# Patient Record
Sex: Male | Born: 1990 | Race: Black or African American | Hispanic: No | Marital: Single | Smoking: Never smoker
Health system: Southern US, Community
[De-identification: ages and names within clinical notes are randomized; demographics above are authoritative.]

## PROBLEM LIST (undated history)

## (undated) HISTORY — PX: DENTAL SURGERY: SHX609

---
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
## 2008-02-28 ENCOUNTER — Emergency Department: Payer: Self-pay | Admitting: Emergency Medicine

## 2010-11-25 ENCOUNTER — Emergency Department: Payer: Self-pay | Admitting: Emergency Medicine

## 2010-11-30 ENCOUNTER — Emergency Department: Payer: Self-pay | Admitting: Emergency Medicine

## 2014-02-12 ENCOUNTER — Emergency Department: Payer: Self-pay | Admitting: Emergency Medicine

## 2014-03-31 ENCOUNTER — Emergency Department: Payer: Self-pay | Admitting: Emergency Medicine

## 2015-03-15 ENCOUNTER — Encounter: Payer: Self-pay | Admitting: Emergency Medicine

## 2015-03-15 ENCOUNTER — Emergency Department
Admission: EM | Admit: 2015-03-15 | Discharge: 2015-03-15 | Disposition: A | Discharge status: Home / Self Care | Payer: Self-pay | Attending: Emergency Medicine | Admitting: Emergency Medicine

## 2015-03-15 DIAGNOSIS — R531 Weakness: Secondary | ICD-10-CM | POA: Insufficient documentation

## 2015-03-15 DIAGNOSIS — R519 Headache, unspecified: Secondary | ICD-10-CM

## 2015-03-15 DIAGNOSIS — R197 Diarrhea, unspecified: Secondary | ICD-10-CM | POA: Insufficient documentation

## 2015-03-15 DIAGNOSIS — R110 Nausea: Secondary | ICD-10-CM | POA: Insufficient documentation

## 2015-03-15 DIAGNOSIS — R51 Headache: Secondary | ICD-10-CM | POA: Insufficient documentation

## 2015-03-15 LAB — CBC
HEMATOCRIT: 38.4 % — AB (ref 40.0–52.0)
HEMOGLOBIN: 13.1 g/dL (ref 13.0–18.0)
MCH: 32.3 pg (ref 26.0–34.0)
MCHC: 34.1 g/dL (ref 32.0–36.0)
MCV: 94.6 fL (ref 80.0–100.0)
PLATELETS: 150 10*3/uL (ref 150–440)
RBC: 4.06 MIL/uL — ABNORMAL LOW (ref 4.40–5.90)
RDW: 13.6 % (ref 11.5–14.5)
WBC: 7.8 10*3/uL (ref 3.8–10.6)

## 2015-03-15 LAB — COMPREHENSIVE METABOLIC PANEL
ALBUMIN: 4.3 g/dL (ref 3.5–5.0)
ALK PHOS: 54 U/L (ref 38–126)
ALT: 12 U/L — AB (ref 17–63)
AST: 19 U/L (ref 15–41)
Anion gap: 7 (ref 5–15)
BUN: 12 mg/dL (ref 6–20)
CALCIUM: 9.7 mg/dL (ref 8.9–10.3)
CHLORIDE: 105 mmol/L (ref 101–111)
CO2: 29 mmol/L (ref 22–32)
Creatinine, Ser: 0.82 mg/dL (ref 0.61–1.24)
GFR calc Af Amer: >60 mL/min (ref >60)
GFR calc non Af Amer: >60 mL/min (ref >60)
GLUCOSE: 100 mg/dL — AB (ref 65–99)
POTASSIUM: 3.7 mmol/L (ref 3.5–5.1)
Sodium: 141 mmol/L (ref 135–145)
Total Bilirubin: <0.1 mg/dL — ABNORMAL LOW (ref 0.3–1.2)
Total Protein: 7.3 g/dL (ref 6.5–8.1)

## 2015-03-15 NOTE — Discharge Instructions (Signed)
Your exam and evaluation are reassuring. Return to emergency department for any new or worsening condition including fever, vomiting, black or bloody stools, concern for dehydration, chest pain, trouble breathing, dizziness passing out.   Diarrhea Diarrhea is frequent loose and watery bowel movements. It can cause you to feel weak and dehydrated. Dehydration can cause you to become tired and thirsty, have a dry mouth, and have decreased urination that often is dark yellow. Diarrhea is a sign of another problem, most often an infection that will not last long. In most cases, diarrhea typically lasts 2-3 days. However, it can last longer if it is a sign of something more serious. It is important to treat your diarrhea as directed by your caregiver to lessen or prevent future episodes of diarrhea. CAUSES  Some common causes include:  Gastrointestinal infections caused by viruses, bacteria, or parasites.  Food poisoning or food allergies.  Certain medicines, such as antibiotics, chemotherapy, and laxatives.  Artificial sweeteners and fructose.  Digestive disorders. HOME CARE INSTRUCTIONS  Ensure adequate fluid intake (hydration): Have 1 cup (8 oz) of fluid for each diarrhea episode. Avoid fluids that contain simple sugars or sports drinks, fruit juices, whole milk products, and sodas. Your urine should be clear or pale yellow if you are drinking enough fluids. Hydrate with an oral rehydration solution that you can purchase at pharmacies, retail stores, and online. You can prepare an oral rehydration solution at home by mixing the following ingredients together:   - tsp table salt.   tsp baking soda.   tsp salt substitute containing potassium chloride.  1  tablespoons sugar.  1 L (34 oz) of water.  Certain foods and beverages may increase the speed at which food moves through the gastrointestinal (GI) tract. These foods and beverages should be avoided and include:  Caffeinated and  alcoholic beverages.  High-fiber foods, such as raw fruits and vegetables, nuts, seeds, and whole grain breads and cereals.  Foods and beverages sweetened with sugar alcohols, such as xylitol, sorbitol, and mannitol.  Some foods may be well tolerated and may help thicken stool including:  Starchy foods, such as rice, toast, pasta, low-sugar cereal, oatmeal, grits, baked potatoes, crackers, and bagels.  Bananas.  Applesauce.  Add probiotic-rich foods to help increase healthy bacteria in the GI tract, such as yogurt and fermented milk products.  Wash your hands well after each diarrhea episode.  Only take over-the-counter or prescription medicines as directed by your caregiver.  Take a warm bath to relieve any burning or pain from frequent diarrhea episodes. SEEK IMMEDIATE MEDICAL CARE IF:   You are unable to keep fluids down.  You have persistent vomiting.  You have blood in your stool, or your stools are black and tarry.  You do not urinate in 6-8 hours, or there is only a small amount of very dark urine.  You have abdominal pain that increases or localizes.  You have weakness, dizziness, confusion, or light-headedness.  You have a severe headache.  Your diarrhea gets worse or does not get better.  You have a fever or persistent symptoms for more than 2-3 days.  You have a fever and your symptoms suddenly get worse. MAKE SURE YOU:   Understand these instructions.  Will watch your condition.  Will get help right away if you are not doing well or get worse. Document Released: 07/18/2002 Document Revised: 12/12/2013 Document Reviewed: 04/04/2012 [facility] Patient Information 2015 [place], [place]. This information is not intended to replace advice given to you  by your health care provider. Make sure you discuss any questions you have with your health care provider.  Headaches, Frequently Asked Questions MIGRAINE HEADACHES Q: What is migraine? What causes it? How can I  treat it? A: Generally, migraine headaches begin as a dull ache. Then they develop into a constant, throbbing, and pulsating pain. You may experience pain at the temples. You may experience pain at the front or back of one or both sides of the head. The pain is usually accompanied by a combination of:  Nausea.  Vomiting.  Sensitivity to light and noise. Some people (about 15%) experience an aura (see below) before an attack. The cause of migraine is believed to be chemical reactions in the brain. Treatment for migraine may include over-the-counter or prescription medications. It may also include self-help techniques. These include relaxation training and biofeedback.  Q: What is an aura? A: About 15% of people with migraine get an "aura". This is a sign of neurological symptoms that occur before a migraine headache. You may see wavy or jagged lines, dots, or flashing lights. You might experience tunnel vision or blind spots in one or both eyes. The aura can include visual or auditory hallucinations (something imagined). It may include disruptions in smell (such as strange odors), taste or touch. Other symptoms include:  Numbness.  A "pins and needles" sensation.  Difficulty in recalling or speaking the correct word. These neurological events may last as long as 60 minutes. These symptoms will fade as the headache begins. Q: What is a trigger? A: Certain physical or environmental factors can lead to or "trigger" a migraine. These include:  Foods.  Hormonal changes.  Weather.  Stress. It is important to remember that triggers are different for everyone. To help prevent migraine attacks, you need to figure out which triggers affect you. Keep a headache diary. This is a good way to track triggers. The diary will help you talk to your healthcare professional about your condition. Q: Does weather affect migraines? A: Bright sunshine, hot, humid conditions, and drastic changes in barometric  pressure may lead to, or "trigger," a migraine attack in some people. But studies have shown that weather does not act as a trigger for everyone with migraines. Q: What is the link between migraine and hormones? A: Hormones start and regulate many of your body's functions. Hormones keep your body in balance within a constantly changing environment. The levels of hormones in your body are unbalanced at times. Examples are during menstruation, pregnancy, or menopause. That can lead to a migraine attack. In fact, about three quarters of all women with migraine report that their attacks are related to the menstrual cycle.  Q: Is there an increased risk of stroke for migraine sufferers? A: The likelihood of a migraine attack causing a stroke is very remote. That is not to say that migraine sufferers cannot have a stroke associated with their migraines. In persons under age 94, the most common associated factor for stroke is migraine headache. But over the course of a person's normal life span, the occurrence of migraine headache may actually be associated with a reduced risk of dying from cerebrovascular disease due to stroke.  Q: What are acute medications for migraine? A: Acute medications are used to treat the pain of the headache after it has started. Examples over-the-counter medications, NSAIDs, ergots, and triptans.  Q: What are the triptans? A: Triptans are the newest class of abortive medications. They are specifically targeted to treat migraine. Triptans  are vasoconstrictors. They moderate some chemical reactions in the brain. The triptans work on receptors in your brain. Triptans help to restore the balance of a neurotransmitter called serotonin. Fluctuations in levels of serotonin are thought to be a main cause of migraine.  Q: Are over-the-counter medications for migraine effective? A: Over-the-counter, or "OTC," medications may be effective in relieving mild to moderate pain and associated  symptoms of migraine. But you should see your caregiver before beginning any treatment regimen for migraine.  Q: What are preventive medications for migraine? A: Preventive medications for migraine are sometimes referred to as "prophylactic" treatments. They are used to reduce the frequency, severity, and length of migraine attacks. Examples of preventive medications include antiepileptic medications, antidepressants, beta-blockers, calcium channel blockers, and NSAIDs (nonsteroidal anti-inflammatory drugs). Q: Why are anticonvulsants used to treat migraine? A: During the past few years, there has been an increased interest in antiepileptic drugs for the prevention of migraine. They are sometimes referred to as "anticonvulsants". Both epilepsy and migraine may be caused by similar reactions in the brain.  Q: Why are antidepressants used to treat migraine? A: Antidepressants are typically used to treat people with depression. They may reduce migraine frequency by regulating chemical levels, such as serotonin, in the brain.  Q: What alternative therapies are used to treat migraine? A: The term "alternative therapies" is often used to describe treatments considered outside the scope of conventional Western medicine. Examples of alternative therapy include acupuncture, acupressure, and yoga. Another common alternative treatment is herbal therapy. Some herbs are believed to relieve headache pain. Always discuss alternative therapies with your caregiver before proceeding. Some herbal products contain arsenic and other toxins. TENSION HEADACHES Q: What is a tension-type headache? What causes it? How can I treat it? A: Tension-type headaches occur randomly. They are often the result of temporary stress, anxiety, fatigue, or anger. Symptoms include soreness in your temples, a tightening band-like sensation around your head (a "vice-like" ache). Symptoms can also include a pulling feeling, pressure sensations, and  contracting head and neck muscles. The headache begins in your forehead, temples, or the back of your head and neck. Treatment for tension-type headache may include over-the-counter or prescription medications. Treatment may also include self-help techniques such as relaxation training and biofeedback. CLUSTER HEADACHES Q: What is a cluster headache? What causes it? How can I treat it? A: Cluster headache gets its name because the attacks come in groups. The pain arrives with little, if any, warning. It is usually on one side of the head. A tearing or bloodshot eye and a runny nose on the same side of the headache may also accompany the pain. Cluster headaches are believed to be caused by chemical reactions in the brain. They have been described as the most severe and intense of any headache type. Treatment for cluster headache includes prescription medication and oxygen. SINUS HEADACHES Q: What is a sinus headache? What causes it? How can I treat it? A: When a cavity in the bones of the face and skull (a sinus) becomes inflamed, the inflammation will cause localized pain. This condition is usually the result of an allergic reaction, a tumor, or an infection. If your headache is caused by a sinus blockage, such as an infection, you will probably have a fever. An x-ray will confirm a sinus blockage. Your caregiver's treatment might include antibiotics for the infection, as well as antihistamines or decongestants.  REBOUND HEADACHES Q: What is a rebound headache? What causes it? How can I treat  it? A: A pattern of taking acute headache medications too often can lead to a condition known as "rebound headache." A pattern of taking too much headache medication includes taking it more than 2 days per week or in excessive amounts. That means more than the label or a caregiver advises. With rebound headaches, your medications not only stop relieving pain, they actually begin to cause headaches. Doctors treat  rebound headache by tapering the medication that is being overused. Sometimes your caregiver will gradually substitute a different type of treatment or medication. Stopping may be a challenge. Regularly overusing a medication increases the potential for serious side effects. Consult a caregiver if you regularly use headache medications more than 2 days per week or more than the label advises. ADDITIONAL QUESTIONS AND ANSWERS Q: What is biofeedback? A: Biofeedback is a self-help treatment. Biofeedback uses special equipment to monitor your body's involuntary physical responses. Biofeedback monitors:  Breathing.  Pulse.  Heart rate.  Temperature.  Muscle tension.  Brain activity. Biofeedback helps you refine and perfect your relaxation exercises. You learn to control the physical responses that are related to stress. Once the technique has been mastered, you do not need the equipment any more. Q: Are headaches hereditary? A: Four out of five (80%) of people that suffer report a family history of migraine. Scientists are not sure if this is genetic or a family predisposition. Despite the uncertainty, a child has a 50% chance of having migraine if one parent suffers. The child has a 75% chance if both parents suffer.  Q: Can children get headaches? A: By the time they reach high school, most young people have experienced some type of headache. Many safe and effective approaches or medications can prevent a headache from occurring or stop it after it has begun.  Q: What type of doctor should I see to diagnose and treat my headache? A: Start with your primary caregiver. Discuss his or her experience and approach to headaches. Discuss methods of classification, diagnosis, and treatment. Your caregiver may decide to recommend you to a headache specialist, depending upon your symptoms or other physical conditions. Having diabetes, allergies, etc., may require a more comprehensive and inclusive approach  to your headache. The National Headache Foundation will provide, upon request, a list of [facility] physician members in your state. Document Released: 10/18/2003 Document Revised: 10/20/2011 Document Reviewed: 03/27/2008 [facility] Patient Information 2015 [place], [place]. This information is not intended to replace advice given to you by your health care provider. Make sure you discuss any questions you have with your health care provider.

## 2015-03-15 NOTE — ED Notes (Signed)
Pt report diarrhea and headache x3 days, reports weakness yesterday and today. Pt reports excedrin and advil around 0800. Pt reports mowing yard yesterday morning.

## 2015-03-15 NOTE — ED Provider Notes (Addendum)
[facility] Emergency Department Provider Note   ____________________________________________  Time seen: 6:30 PM I have reviewed the triage vital signs and the triage nursing note.  HISTORY  Chief Complaint Diarrhea; Weakness; and Headache   Historian Patient  HPI Alejandro Cameron is a 24 y.o. male who had mild diarrhea, not black or bloody, and without fever for about 3 days. He's had some nausea without any vomiting. He's had decreased by mouth intake. He was outside in the heat yesterday and felt a little bit worse. He has had intermittent mild global headache. No neck pain or stiffness. No skin rash. He was out of work yesterday and needs a work note. He has no primary care physician, and needs referral for primary care    History reviewed. No pertinent past medical history. none  There are no active problems to display for this patient.   History reviewed. No pertinent past surgical history. none  No current outpatient prescriptions on file. none  Allergies Ibuprofen  No family history on file.  Social History History  Substance Use Topics  . Smoking status: Never Smoker   . Smokeless tobacco: Not on file  . Alcohol Use: No    Review of Systems  Constitutional: Negative for fever. Eyes: Negative for visual changes. ENT: Negative for sore throat. Cardiovascular: Negative for chest pain. Respiratory: Negative for shortness of breath. Gastrointestinal: Per history of present illness. Genitourinary: Negative for dysuria. Musculoskeletal: Negative for back pain. Skin: Negative for rash. Neurological: Negative for  focal weakness or numbness. 10 point Review of Systems otherwise negative ____________________________________________   PHYSICAL EXAM:  VITAL SIGNS: ED Triage Vitals  Enc Vitals Group     BP 03/15/15 1758 118/76 mmHg     Pulse Rate 03/15/15 1758 70     Resp 03/15/15 1758 16     Temp 03/15/15 1758 98.1 F (36.7 C)      Temp Source 03/15/15 1758 Oral     SpO2 03/15/15 1758 97 %     Weight 03/15/15 1758 165 lb (74.844 kg)     Height 03/15/15 1758  (1.88 m)     Head Cir --      Peak Flow --      Pain Score 03/15/15 1758 0     Pain Loc --      Pain Edu? --      Excl. in GC? --      Constitutional: Alert and oriented. Well appearing and in no distress. Eyes: Conjunctivae are normal. PERRL. Normal extraocular movements. ENT   Head: Normocephalic and atraumatic.   Nose: No congestion/rhinnorhea.   Mouth/Throat: Mucous membranes are moist.   Neck: No stridor. Cardiovascular/Chest: Normal rate, regular rhythm.  No murmurs, rubs, or gallops. Respiratory: Normal respiratory effort without tachypnea nor retractions. Breath sounds are clear and equal bilaterally. No wheezes/rales/rhonchi. Gastrointestinal: Soft. No distention, no guarding, no rebound. Nontender   Genitourinary/rectal:Deferred Musculoskeletal: Nontender with normal range of motion in all extremities. No joint effusions.  No lower extremity tenderness nor edema. Neurologic:  Normal speech and language. No gross or focal neurologic deficits are appreciated. Skin:  Skin is warm, dry and intact. No rash noted. Psychiatric: Mood and affect are normal. Speech and behavior are normal. Patient exhibits appropriate insight and judgment.  ____________________________________________   EKG I, Governor Rooks, MD, the attending physician have personally viewed and interpreted all ECGs.  No EKG performed ____________________________________________  LABS (pertinent positives/negatives)  CMP without significant abnormality White blood cell count  7.8, hemoglobin 13.1, platelet 150  ____________________________________________  RADIOLOGY All Xrays were viewed by me. Imaging interpreted by Radiologist.  None __________________________________________  PROCEDURES  Procedure(s) performed: None Critical Care performed:  None  ____________________________________________   ED COURSE / ASSESSMENT AND PLAN  CONSULTATIONS: None  Pertinent labs & imaging results that were available during my care of the patient were reviewed by me and considered in my medical decision making (see chart for details).   Patient is overall well-appearing and currently has no abdominal pain, headache or diarrhea. I think he was mostly here in order to obtain reassurance and work note. We discussed with during vitals, physical exam, and laboratory evaluation. He was referred to [facility] clinic for primary care follow-up.  Patient / Family / Caregiver informed of clinical course, medical decision-making process, and agree with plan.   I discussed return precautions, follow-up instructions, and discharged instructions with patient and/or family.  ___________________________________________   FINAL CLINICAL IMPRESSION(S) / ED DIAGNOSES   Final diagnoses:  Diarrhea  Acute nonintractable headache, unspecified headache type    FOLLOW UP  Referred to: [place] [facility], one week   Governor Rooks, MD 03/15/15 1851  Governor Rooks, MD 03/15/15 [phone]

## 2015-03-15 NOTE — ED Notes (Signed)
AAOx3.  Skin warm and dry. NAD.  Ambulates with easy and steady gait. NAD °

## 2015-09-20 ENCOUNTER — Encounter: Payer: Self-pay | Admitting: Emergency Medicine

## 2015-09-20 ENCOUNTER — Emergency Department: Payer: Self-pay

## 2015-09-20 DIAGNOSIS — G43909 Migraine, unspecified, not intractable, without status migrainosus: Secondary | ICD-10-CM | POA: Insufficient documentation

## 2015-09-20 NOTE — ED Notes (Signed)
Pt arrived to the ED for complaints of severe headache. Pt states that nothing helps. Pt is AOx4 in moderate pain stating that the light makes it worse.

## 2015-09-21 ENCOUNTER — Emergency Department
Admission: EM | Admit: 2015-09-21 | Discharge: 2015-09-21 | Disposition: A | Discharge status: Home / Self Care | Payer: Self-pay | Attending: Emergency Medicine | Admitting: Emergency Medicine

## 2015-09-21 DIAGNOSIS — G43009 Migraine without aura, not intractable, without status migrainosus: Secondary | ICD-10-CM

## 2015-09-21 MED ORDER — DEXAMETHASONE SODIUM PHOSPHATE 10 MG/ML IJ SOLN
10.0000 mg | Freq: Once | INTRAMUSCULAR | Status: AC
  Administered 2015-09-21: 10 mg via INTRAVENOUS
  Filled 2015-09-21: qty 1

## 2015-09-21 MED ORDER — ACETAMINOPHEN 500 MG PO TABS
1000.0000 mg | ORAL_TABLET | Freq: Once | ORAL | Status: AC
  Administered 2015-09-21: 1000 mg via ORAL
  Filled 2015-09-21: qty 2

## 2015-09-21 MED ORDER — KETOROLAC TROMETHAMINE 30 MG/ML IJ SOLN
15.0000 mg | Freq: Once | INTRAMUSCULAR | Status: AC
  Administered 2015-09-21: 15 mg via INTRAVENOUS
  Filled 2015-09-21: qty 1

## 2015-09-21 MED ORDER — DIPHENHYDRAMINE HCL 50 MG/ML IJ SOLN
25.0000 mg | Freq: Once | INTRAMUSCULAR | Status: AC
  Administered 2015-09-21: 25 mg via INTRAVENOUS
  Filled 2015-09-21: qty 1

## 2015-09-21 MED ORDER — METOCLOPRAMIDE HCL 5 MG/ML IJ SOLN
10.0000 mg | Freq: Once | INTRAMUSCULAR | Status: AC
Start: 2015-09-21 — End: 2015-09-21
  Administered 2015-09-21: 10 mg via INTRAVENOUS
  Filled 2015-09-21: qty 2

## 2015-09-21 MED ORDER — SODIUM CHLORIDE 0.9 % IV BOLUS (SEPSIS)
1000.0000 mL | INTRAVENOUS | Status: AC
  Administered 2015-09-21: 1000 mL via INTRAVENOUS

## 2015-09-21 NOTE — ED Notes (Signed)
Notified Brandon in pharmacy regarding toradol not scanning.

## 2015-09-21 NOTE — ED Provider Notes (Signed)
[facility] Emergency Department Provider Note  ____________________________________________  Time seen: Approximately 12:38 AM  I have reviewed the triage vital signs and the nursing notes.   HISTORY  Chief Complaint Headache    HPI Alejandro Cameron is a 25 y.o. male with no significant past medical history except for a couple of prior headaches who presents with severe headache for the last 2-3 days.  He states that he has been gradual in onset, global, and severe.  Light and sound both make it worse.  Rest makes it a little bit better but it will not go away.  He has had no focal neurological deficits.  He has had no fever/chills, neck pain, neck stiffness, shortness of breath, abdominal pain, dysuria, diarrhea.  He has had some nausea but no vomiting.He has had no recent medication changes.  He smokes tobacco but does not drink alcohol or use drugs.   History reviewed. No pertinent past medical history.  There are no active problems to display for this patient.   History reviewed. No pertinent past surgical history.  No current outpatient prescriptions on file.  Allergies Ibuprofen  History reviewed. No pertinent family history.  Social History Social History  Substance Use Topics  . Smoking status: Never Smoker   . Smokeless tobacco: None  . Alcohol Use: No    Review of Systems Constitutional: No fever/chills Eyes: No visual changes but positive for photophobia ENT: No sore throat.  No neck pain or stiffness. Cardiovascular: Denies chest pain. Respiratory: Denies shortness of breath. Gastrointestinal: No abdominal pain.  nausea, no vomiting.  No diarrhea.  No constipation. Genitourinary: Negative for dysuria. Musculoskeletal: Negative for back pain. Skin: Negative for rash. Neurological: Severe global headache with no focal neurological deficits  10-point ROS otherwise  negative.  ____________________________________________   PHYSICAL EXAM:  VITAL SIGNS: ED Triage Vitals  Enc Vitals Group     BP 09/20/15 2243 113/58 mmHg     Pulse Rate 09/20/15 2243 55     Resp 09/20/15 2243 16     Temp 09/20/15 2243 97.7 F (36.5 C)     Temp src --      SpO2 09/20/15 2243 100 %     Weight 09/20/15 2243 168 lb (76.204 kg)     Height 09/20/15 2243  (1.88 m)     Head Cir --      Peak Flow --      Pain Score 09/20/15 2245 10     Pain Loc --      Pain Edu? --      Excl. in GC? --     Constitutional: Alert and oriented. Well appearing and in no acute distress that does not appear uncomfortable  Eyes: Conjunctivae are normal. PERRL. EOMI. sensitivity to light makes it difficult to obtain an adequate funduscopic exam. Head: Atraumatic. Nose: No congestion/rhinnorhea. Mouth/Throat: Mucous membranes are moist.  Oropharynx non-erythematous. Neck: No stridor.  No meningismus Cardiovascular: Normal rate, regular rhythm. Grossly normal heart sounds.  Good peripheral circulation. Respiratory: Normal respiratory effort.  No retractions. Lungs CTAB. Gastrointestinal: Soft and nontender. No distention. No abdominal bruits. No CVA tenderness. Musculoskeletal: No lower extremity tenderness nor edema.  No joint effusions. Neurologic:  Normal speech and language. No gross focal neurologic deficits are appreciated.  Skin:  Skin is warm, dry and intact. No rash noted. Psychiatric: Mood and affect are normal. Speech and behavior are normal.  ____________________________________________   LABS (all labs ordered are listed, but only abnormal  results are displayed)  Labs Reviewed - No data to display ____________________________________________  EKG  None ____________________________________________  RADIOLOGY   Ct Head Wo Contrast  09/20/2015  CLINICAL DATA:  Acute onset of severe headache. Photosensitivity. Initial encounter. EXAM: CT HEAD WITHOUT CONTRAST  TECHNIQUE: Contiguous axial images were obtained from the base of the skull through the vertex without intravenous contrast. COMPARISON:  CT of the head performed 11/05/2007 FINDINGS: There is no evidence of acute infarction, mass lesion, or intra- or extra-axial hemorrhage on CT. The posterior fossa, including the cerebellum, brainstem and fourth ventricle, is within normal limits. The third and lateral ventricles, and basal ganglia are unremarkable in appearance. The cerebral hemispheres are symmetric in appearance, with normal gray-white differentiation. No mass effect or midline shift is seen. There is no evidence of fracture; visualized osseous structures are unremarkable in appearance. The visualized portions of the orbits are within normal limits. The paranasal sinuses and mastoid air cells are well-aerated. No significant soft tissue abnormalities are seen. IMPRESSION: Unremarkable noncontrast CT of the head. Electronically Signed   By: Roanna Raider M.D.   On: 09/20/2015 23:06    ____________________________________________   PROCEDURES  Procedure(s) performed: None  Critical Care performed: No ____________________________________________   INITIAL IMPRESSION / ASSESSMENT AND PLAN / ED COURSE  Pertinent labs & imaging results that were available during my care of the patient were reviewed by me and considered in my medical decision making (see chart for details).  Signs and symptoms most consistent with a migraine headache.  I will treat empirically with my usual nonnarcotic "migraine cocktail" and reassess.  The patient understands and agrees with plan.   (Note that documentation was delayed due to multiple ED patients requiring immediate care.)   Talbert Forest after receiving the medications, the patient states he feels better and wants to go home.  He said that his headache is improved and that he would rather lie in bed at home than here in the emergency department.  He has a ride  home.  I encouraged him to stay in order to allow the medications more time to work and to receive his IV fluids, but he refused and stated that he was going to leave whether I let him do so or not.  I provided my usual and customary written discharge instructions and return precautions.  He ambulated without any difficulty out of the emergency department and was going home with his grandmother driving.  ____________________________________________  FINAL CLINICAL IMPRESSION(S) / ED DIAGNOSES  Final diagnoses:  Nonintractable migraine, unspecified migraine type      NEW MEDICATIONS STARTED DURING THIS VISIT:  There are no discharge medications for this patient.    Loleta Rose, MD 09/21/15 [phone]

## 2015-09-21 NOTE — Discharge Instructions (Signed)
You have been seen in the Emergency Department (ED) for a migraine.  Please use Tylenol or Motrin as needed for symptoms, but only as written on the box, and take any regular medications that have been prescribed for you. °As we have discussed, please follow up with your doctor as soon as possible regarding today’s ED visit and your headache symptoms.   ° °Call your doctor or return to the Emergency Department (ED) if you have a worsening headache, sudden and severe headache, confusion, slurred speech, facial droop, weakness or numbness in any arm or leg, extreme fatigue, or other symptoms that concern you. ° ° °Migraine Headache °A migraine headache is an intense, throbbing pain on one or both sides of your head. A migraine can last for 30 minutes to several hours. °CAUSES  °The exact cause of a migraine headache is not always known. However, a migraine may be caused when nerves in the brain become irritated and release chemicals that cause inflammation. This causes pain. °Certain things may also trigger migraines, such as: °· Alcohol. °· Smoking. °· Stress. °· Menstruation. °· Aged cheeses. °· Foods or drinks that contain nitrates, glutamate, aspartame, or tyramine. °· Lack of sleep. °· Chocolate. °· Caffeine. °· Hunger. °· Physical exertion. °· Fatigue. °· Medicines used to treat chest pain (nitroglycerine), birth control pills, estrogen, and some blood pressure medicines. °SIGNS AND SYMPTOMS °· Pain on one or both sides of your head. °· Pulsating or throbbing pain. °· Severe pain that prevents daily activities. °· Pain that is aggravated by any physical activity. °· Nausea, vomiting, or both. °· Dizziness. °· Pain with exposure to bright lights, loud noises, or activity. °· General sensitivity to bright lights, loud noises, or smells. °Before you get a migraine, you may get warning signs that a migraine is coming (aura). An aura may include: °· Seeing flashing lights. °· Seeing bright spots, halos, or zigzag  lines. °· Having tunnel vision or blurred vision. °· Having feelings of numbness or tingling. °· Having trouble talking. °· Having muscle weakness. °DIAGNOSIS  °A migraine headache is often diagnosed based on: °· Symptoms. °· Physical exam. °· A CT scan or MRI of your head. These imaging tests cannot diagnose migraines, but they can help rule out other causes of headaches. °TREATMENT °Medicines may be given for pain and nausea. Medicines can also be given to help prevent recurrent migraines.  °HOME CARE INSTRUCTIONS °· Only take over-the-counter or prescription medicines for pain or discomfort as directed by your health care provider. The use of long-term narcotics is not recommended. °· Lie down in a dark, quiet room when you have a migraine. °· Keep a journal to find out what may trigger your migraine headaches. For example, write down: °¨ What you eat and drink. °¨ How much sleep you get. °¨ Any change to your diet or medicines. °· Limit alcohol consumption. °· Quit smoking if you smoke. °· Get 7-9 hours of sleep, or as recommended by your health care provider. °· Limit stress. °· Keep lights dim if bright lights bother you and make your migraines worse. °SEEK IMMEDIATE MEDICAL CARE IF:  °· Your migraine becomes severe. °· You have a fever. °· You have a stiff neck. °· You have vision loss. °· You have muscular weakness or loss of muscle control. °· You start losing your balance or have trouble walking. °· You feel faint or pass out. °· You have severe symptoms that are different from your first symptoms. °MAKE SURE YOU:  °·   Understand these instructions. °· Will watch your condition. °· Will get help right away if you are not doing well or get worse. °  °This information is not intended to replace advice given to you by your health care provider. Make sure you discuss any questions you have with your health care provider. °  °Document Released: 07/28/2005 Document Revised: 08/18/2014 Document Reviewed:  04/04/2013 °Elsevier Interactive Patient Education ©2016 [employer] ° ° ° °

## 2015-09-21 NOTE — ED Notes (Signed)
Pt discharged to home.  Family member driving.  Discharge instructions reviewed.  Verbalized understanding.  No questions or concerns at this time.  Teach back verified.  Pt in NAD.  No items left in ED.   

## 2015-09-21 NOTE — ED Notes (Signed)
Pt refusing IV fluids.  States that he feels that he will be more comfortable at home and states that he feels better at this time.

## 2015-12-24 ENCOUNTER — Emergency Department (HOSPITAL_COMMUNITY): Payer: Self-pay

## 2015-12-24 ENCOUNTER — Encounter (HOSPITAL_COMMUNITY): Payer: Self-pay | Admitting: Emergency Medicine

## 2015-12-24 ENCOUNTER — Emergency Department (HOSPITAL_COMMUNITY)
Admission: EM | Admit: 2015-12-24 | Discharge: 2015-12-24 | Disposition: A | Discharge status: Home / Self Care | Payer: Self-pay | Attending: Emergency Medicine | Admitting: Emergency Medicine

## 2015-12-24 DIAGNOSIS — Y92310 Basketball court as the place of occurrence of the external cause: Secondary | ICD-10-CM | POA: Insufficient documentation

## 2015-12-24 DIAGNOSIS — S4991XA Unspecified injury of right shoulder and upper arm, initial encounter: Secondary | ICD-10-CM | POA: Insufficient documentation

## 2015-12-24 DIAGNOSIS — Y9367 Activity, basketball: Secondary | ICD-10-CM | POA: Insufficient documentation

## 2015-12-24 DIAGNOSIS — Y998 Other external cause status: Secondary | ICD-10-CM | POA: Insufficient documentation

## 2015-12-24 DIAGNOSIS — M25511 Pain in right shoulder: Secondary | ICD-10-CM

## 2015-12-24 DIAGNOSIS — X58XXXA Exposure to other specified factors, initial encounter: Secondary | ICD-10-CM | POA: Insufficient documentation

## 2015-12-24 MED ORDER — DIAZEPAM 5 MG PO TABS
5.0000 mg | ORAL_TABLET | Freq: Once | ORAL | Status: AC
  Administered 2015-12-24: 5 mg via ORAL
  Filled 2015-12-24: qty 1

## 2015-12-24 MED ORDER — OXYCODONE-ACETAMINOPHEN 5-325 MG PO TABS
1.0000 | ORAL_TABLET | ORAL | Status: AC | PRN
  Administered 2015-12-24 (×2): 1 via ORAL
  Filled 2015-12-24 (×2): qty 1

## 2015-12-24 MED ORDER — NAPROXEN 250 MG PO TABS
500.0000 mg | ORAL_TABLET | Freq: Once | ORAL | Status: AC
  Administered 2015-12-24: 500 mg via ORAL
  Filled 2015-12-24: qty 2

## 2015-12-24 MED ORDER — NAPROXEN 500 MG PO TABS: 500.0000 mg | ORAL_TABLET | Freq: Two times a day (BID) | ORAL | Status: DC

## 2015-12-24 MED ORDER — DIAZEPAM 5 MG PO TABS: 5.0000 mg | ORAL_TABLET | Freq: Two times a day (BID) | ORAL | Status: DC

## 2015-12-24 NOTE — Discharge Instructions (Signed)
Mr. Linton HamDameon T Delagarza,  Nice meeting you! Please follow-up with your primary care provider. Return to the emergency department if you develop inability to move your arm, numbness/tingling, increased pain. Feel better soon!  S. Lane HackerNicole Twala Collings, PA-C

## 2015-12-24 NOTE — ED Notes (Signed)
Pt states was playing basketball on Friday and went to dunk the ball jumped in the air AND HAD HIS LEGS TAKEN OUT FROM UNDER HIM AND FELL ONTO RIGHT SHOULDER. PT C/O OF PAIN IN RIGHT SHOULDER WORSE WHEN LIFTING HIS RIGHT OUTWARD. Pt has equal radial pulses with sensation intact.

## 2015-12-24 NOTE — ED Notes (Signed)
Patient able to ambulate independently  

## 2015-12-24 NOTE — ED Provider Notes (Signed)
History  By signing my name below, I, Earmon Phoenix, attest that this documentation has been prepared under the direction and in the presence of S. Lane Hacker, PA-C. Electronically Signed: Earmon Phoenix, ED Scribe. 12/24/2015. 3:11 PM.  Chief Complaint  Patient presents with  . Shoulder Injury   The history is provided by the patient and medical records. No language interpreter was used.    HPI Comments:  Alejandro Cameron is a 25 y.o. male who presents to the Emergency Department complaining of a right shoulder injury that occurred three days ago. He states he was playing basketball and jumped in the air and his legs were knocked out from underneath him, causing his to land on his right shoulder. He reports associated neck pain. He has not taken anything for pain. Lifting his RUE increases the pain. He denies alleviating factors. He denies head trauma, LOC, bruising, wounds, nausea, vomiting, fever, chills, numbness, tingling or weakness of either upper extremity.   History reviewed. No pertinent past medical history. History reviewed. No pertinent past surgical history. No family history on file. Social History  Substance Use Topics  . Smoking status: Never Smoker   . Smokeless tobacco: None  . Alcohol Use: No    Review of Systems A complete 10 system review of systems was obtained and all systems are negative except as noted in the HPI and PMH.   Allergies  Ibuprofen  Home Medications   Prior to Admission medications   Medication Sig Start Date End Date Taking? Authorizing Provider  diazepam (VALIUM) 5 MG tablet Take 1 tablet (5 mg total) by mouth 2 (two) times daily. 12/24/15   Melton Krebs, PA-C  naproxen (NAPROSYN) 500 MG tablet Take 1 tablet (500 mg total) by mouth 2 (two) times daily. 12/24/15   Melton Krebs, PA-C   Triage Vitals: BP 113/71 mmHg  Pulse 88  Temp(Src) 98 F (36.7 C) (Oral)  Resp 16  Ht 6' 2.5" (1.892 m)  Wt 153 lb 4 oz (69.514  kg)  BMI 19.42 kg/m2  SpO2 98% Physical Exam  Constitutional: He is oriented to person, place, and time. He appears well-developed and well-nourished.  HENT:  Head: Normocephalic and atraumatic.  Eyes: EOM are normal.  Neck: Normal range of motion.  Cardiovascular: Normal rate.   Pulmonary/Chest: Effort normal.  Musculoskeletal: He exhibits tenderness.  Midline cervical tenderness. Diffuse right shoulder tenderness.  Neurological: He is alert and oriented to person, place, and time.  Strength 4/5 to right side, 5/5 on left. NVI.  Skin: Skin is warm and dry.  Psychiatric: He has a normal mood and affect. His behavior is normal.  Nursing note and vitals reviewed.   ED Course  Procedures DIAGNOSTIC STUDIES: Oxygen Saturation is 98% on RA, normal by my interpretation.   COORDINATION OF CARE: 3:10 PM- Will X-Ray neck. Pt verbalizes understanding and agrees to plan.  Medications  oxyCODONE-acetaminophen (PERCOCET/ROXICET) 5-325 MG per tablet 1 tablet (1 tablet Oral Given 12/24/15 1455)  diazepam (VALIUM) tablet 5 mg (5 mg Oral Given 12/24/15 1509)  naproxen (NAPROSYN) tablet 500 mg (500 mg Oral Given 12/24/15 1509)   Imaging Review Dg Cervical Spine Complete  12/24/2015  CLINICAL DATA:  Pain following fall 3 days prior EXAM: CERVICAL SPINE - COMPLETE 4+ VIEW COMPARISON:  None. FINDINGS: Frontal, lateral, open-mouth odontoid, and bilateral oblique views were obtained. There is dextroscoliosis and reversal of lordotic curvature. There is no fracture or spondylolisthesis. Prevertebral soft tissues and predental space regions are normal.  The disc spaces appear normal. There are small facet osteophytes at C4-5 and C5-6 bilaterally. IMPRESSION: No fracture or spondylolisthesis. Small facet osteophytes bilaterally at C4-5 and C5-6. Reversal of lordotic curvature and scoliosis most likely are secondary to muscle spasm. If there is clinical suspicion for ligamentous injury, lateral flexion-extension  views could be helpful to further assess. Electronically Signed   By: Bretta BangWilliam  Woodruff III M.D.   On: 12/24/2015 15:55   Dg Shoulder Right  12/24/2015  CLINICAL DATA:  Fall 3 days ago playing basketball with right shoulder pain. Initial encounter. EXAM: RIGHT SHOULDER - 2+ VIEW COMPARISON:  None. FINDINGS: There is no evidence of fracture or dislocation. There is no evidence of arthropathy or other focal bone abnormality. Soft tissues are unremarkable. IMPRESSION: Negative. Electronically Signed   By: Irish LackGlenn  Yamagata M.D.   On: 12/24/2015 14:46     I have personally reviewed and evaluated these images and lab results as part of my medical decision-making.   MDM   Final diagnoses:  Right shoulder pain   Patient X-Ray negative for obvious fracture or dislocation. Pain managed in ED. Pt advised to follow up with orthopedics if symptoms persist for possibility of missed fracture diagnosis. Patient given sling while in ED, conservative therapy recommended and discussed. Patient will be dc home & is agreeable with above plan.  I personally performed the services described in this documentation, which was scribed in my presence. The recorded information has been reviewed and is accurate.   Melton KrebsSamantha Nicole Eiko Mcgowen, PA-C 12/27/15 1129  Margarita Grizzleanielle Ray, MD 12/29/15 1430

## 2016-10-12 ENCOUNTER — Emergency Department
Admission: EM | Admit: 2016-10-12 | Discharge: 2016-10-12 | Disposition: A | Discharge status: Home / Self Care | Payer: Self-pay | Attending: Emergency Medicine | Admitting: Emergency Medicine

## 2016-10-12 ENCOUNTER — Encounter: Payer: Self-pay | Admitting: Emergency Medicine

## 2016-10-12 DIAGNOSIS — Z79899 Other long term (current) drug therapy: Secondary | ICD-10-CM | POA: Insufficient documentation

## 2016-10-12 DIAGNOSIS — K029 Dental caries, unspecified: Secondary | ICD-10-CM | POA: Insufficient documentation

## 2016-10-12 DIAGNOSIS — K0889 Other specified disorders of teeth and supporting structures: Secondary | ICD-10-CM

## 2016-10-12 MED ORDER — KETOROLAC TROMETHAMINE 10 MG PO TABS: 10.0000 mg | ORAL_TABLET | Freq: Four times a day (QID) | ORAL | 0 refills | Status: AC | PRN

## 2016-10-12 MED ORDER — KETOROLAC TROMETHAMINE 60 MG/2ML IM SOLN
30.0000 mg | Freq: Once | INTRAMUSCULAR | Status: AC
  Administered 2016-10-12: 30 mg via INTRAMUSCULAR
  Filled 2016-10-12: qty 2

## 2016-10-12 MED ORDER — AMOXICILLIN 500 MG PO CAPS: 500.0000 mg | ORAL_CAPSULE | Freq: Three times a day (TID) | ORAL | 0 refills | Status: AC

## 2016-10-12 NOTE — ED Provider Notes (Signed)
[facility] Emergency Department Provider Note  ____________________________________________  Time seen: Approximately 3:49 PM  I have reviewed the triage vital signs and the nursing notes.   HISTORY  Chief Complaint Dental Pain   HPI Alejandro Cameron is a 26 y.o. male presenting to the emergency department with 8/10 superior 3 pain for the past 5 days. Patient's dental pain is worse with chewing. Patient states that superior 3 has been "broken" and affected by dental caries. Patient has an appointment with a dentist on 10/16/2016. He presents to the emergency department due to increasing pain. Patient states that he had a small abscess on the roof of his mouth that began to spontaneously express purulent material. Patient denies fever. Patient states that he has tried Advanced care toothpaste, Listerine, Goody powders and Tylenol, which have not relieved his symptoms.   History reviewed. No pertinent past medical history.  There are no active problems to display for this patient.   History reviewed. No pertinent surgical history.  Prior to Admission medications   Medication Sig Start Date End Date Taking? Authorizing Provider  amoxicillin (AMOXIL) 500 MG capsule Take 1 capsule (500 mg total) by mouth 3 (three) times daily. 10/12/16 10/22/16  Orvil FeilJaclyn M Woods, PA-C  diazepam (VALIUM) 5 MG tablet Take 1 tablet (5 mg total) by mouth 2 (two) times daily. 12/24/15   Melton KrebsSamantha Nicole Riley, PA-C  ketorolac (TORADOL) 10 MG tablet Take 1 tablet (10 mg total) by mouth every 6 (six) hours as needed. 10/12/16 10/17/16  Orvil FeilJaclyn M Woods, PA-C  naproxen (NAPROSYN) 500 MG tablet Take 1 tablet (500 mg total) by mouth 2 (two) times daily. 12/24/15   Melton KrebsSamantha Nicole Riley, PA-C    Allergies Ibuprofen  History reviewed. No pertinent family history.  Social History Social History  Substance Use Topics  . Smoking status: Never Smoker  . Smokeless tobacco: Never Used  . Alcohol use No     Review of Systems  Constitutional: No fever/chills Eyes: No visual changes. No discharge ENT: Patient has dental pain.  Cardiovascular: no chest pain. Respiratory: no cough. No SOB. Gastrointestinal: No abdominal pain.  No nausea, no vomiting.  No diarrhea.  No constipation. Musculoskeletal: Negative for musculoskeletal pain. Skin: Negative for rash, abrasions, lacerations, ecchymosis. Neurological: Negative for headaches, focal weakness or numbness. ____________________________________________   PHYSICAL EXAM:  VITAL SIGNS: ED Triage Vitals  Enc Vitals Group     BP 10/12/16 1449 121/64     Pulse Rate 10/12/16 1449 84     Resp 10/12/16 1449 16     Temp 10/12/16 1449 98.2 F (36.8 C)     Temp Source 10/12/16 1449 Oral     SpO2 10/12/16 1449 99 %     Weight 10/12/16 1449 170 lb (77.1 kg)     Height 10/12/16 1449 6' 2.5" (1.892 m)     Head Circumference --      Peak Flow --      Pain Score 10/12/16 1519 8     Pain Loc --      Pain Edu? --      Excl. in GC? --     Constitutional: Alert and oriented. Well appearing and in no acute distress. Eyes: Conjunctivae are normal. PERRL. EOMI. Head: Atraumatic. ENT:      Ears: Tympanic membranes are pearly bilaterally without erythema, effusion or purulent exudate.      Nose: No congestion/rhinnorhea.      Mouth/Throat: Patient has broken superior 3, which is also affected  by dental caries. No gingival hypertrophy or abscess visualized surrounding superior 3. Neck: Full range of motion. Hematological/Lymphatic/Immunilogical: No cervical lymphadenopathy. Cardiovascular: Normal rate, regular rhythm. Normal S1 and S2.  Good peripheral circulation. Respiratory: Normal respiratory effort without tachypnea or retractions. Lungs CTAB. Good air entry to the bases with no decreased or absent breath sounds. Musculoskeletal: Jaw appears symmetric. No pain was elicited with TMJ testing bilaterally. Neurologic:  Normal speech and language.  No gross focal neurologic deficits are appreciated.  Skin: Patient has no edema of the right jaw.  ____________________________________________   LABS (all labs ordered are listed, but only abnormal results are displayed)  Labs Reviewed - No data to display ____________________________________________  EKG   ____________________________________________  RADIOLOGY   No results found.  ____________________________________________    PROCEDURES  Procedure(s) performed:    Procedures    Medications  ketorolac (TORADOL) injection 30 mg (30 mg Intramuscular Given 10/12/16 1603)     ____________________________________________   INITIAL IMPRESSION / ASSESSMENT AND PLAN / ED COURSE  Pertinent labs & imaging results that were available during my care of the patient were reviewed by me and considered in my medical decision making (see chart for details).  Review of the [place] CSRS was performed in accordance of the [facility] prior to dispensing any controlled drugs.     Dental Pain:  Assessment and Plan  Patient presents to the emergency department with superior 3 pain. On physical exam, superior 3 was visualized as broken with dental caries. No jaw edema was visualized. Patient was discharged with amoxicillin. An injection of Toradol was given in the emergency department. Patient was discharged with PO Toradol. Patient was advised to maintain dental appointment on 10/16/2016. Vital signs are reassuring at this time. All patient questions were answered. ____________________________________________  FINAL CLINICAL IMPRESSION(S) / ED DIAGNOSES  Final diagnoses:  Pain, dental      NEW MEDICATIONS STARTED DURING THIS VISIT:  Discharge Medication List as of 10/12/2016  4:03 PM    START taking these medications   Details  amoxicillin (AMOXIL) 500 MG capsule Take 1 capsule (500 mg total) by mouth 3 (three) times daily., Starting Sun 10/12/2016, Until Wed 10/22/2016, Print     ketorolac (TORADOL) 10 MG tablet Take 1 tablet (10 mg total) by mouth every 6 (six) hours as needed., Starting Sun 10/12/2016, Until Fri 10/17/2016, Print            This chart was dictated using voice recognition software/Dragon. Despite best efforts to proofread, errors can occur which can change the meaning. Any change was purely unintentional.    Orvil Feil, PA-C 10/12/16 1704    Phineas Semen, MD 10/12/16 [phone]

## 2016-10-12 NOTE — ED Triage Notes (Signed)
Pt reports dental pain x 4 days, states something popped in his mouth and tasted terrible.  Tried to get into the dentist unsuccessful

## 2017-03-03 IMAGING — CT CT HEAD W/O CM
1 series · 16 of 30 positions shown, 20 images · non-contrast
Comparison: CT of the head performed 11/05/2007

CLINICAL DATA: Acute onset of severe headache. Photosensitivity.
Initial encounter.

EXAM:
CT HEAD WITHOUT CONTRAST
TECHNIQUE: Contiguous axial images were obtained from the base of the skull
through the vertex without intravenous contrast.

[Series 2: head wo · axial · 0.45mm/px · z∈[+435,+579]mm · 16 of 36 slices shown, 20 images]
[im 2/36  brain]
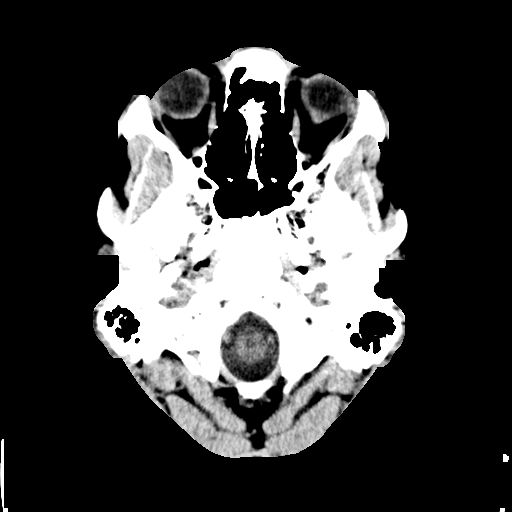
[im 2/36  bone]
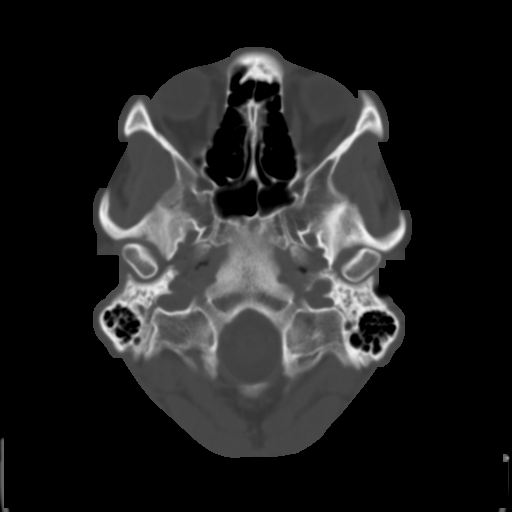
[im 4/36  brain]
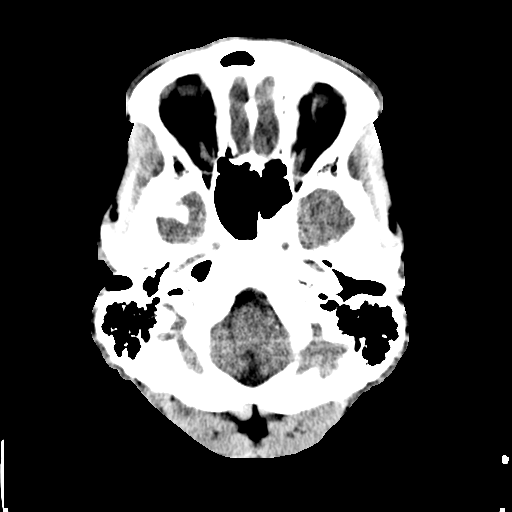
[im 7/36  brain]
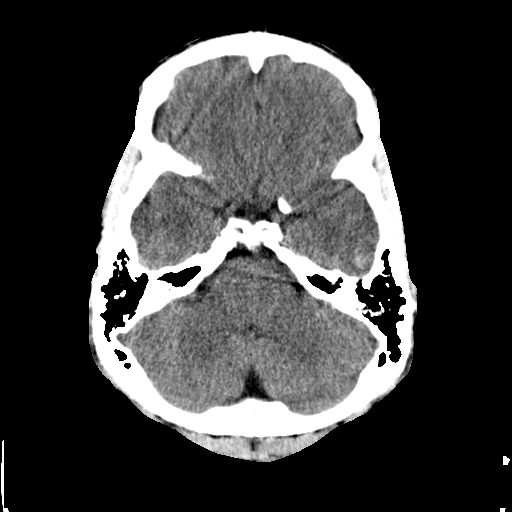
[im 9/36  brain]
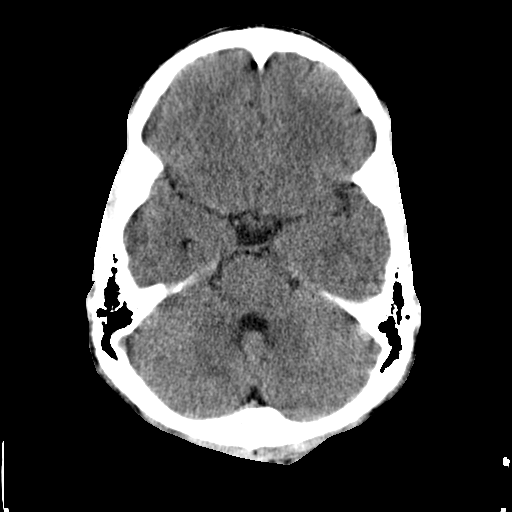
[im 10/36  brain]
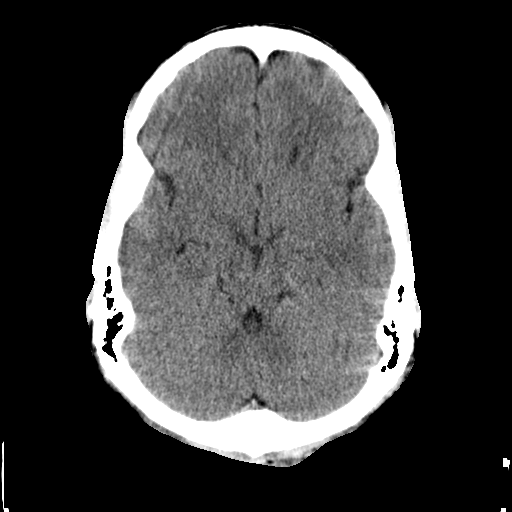
[im 10/36  bone]
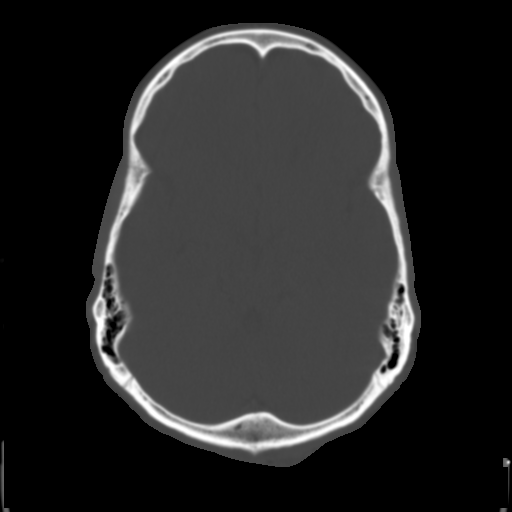
[im 13/36  brain]
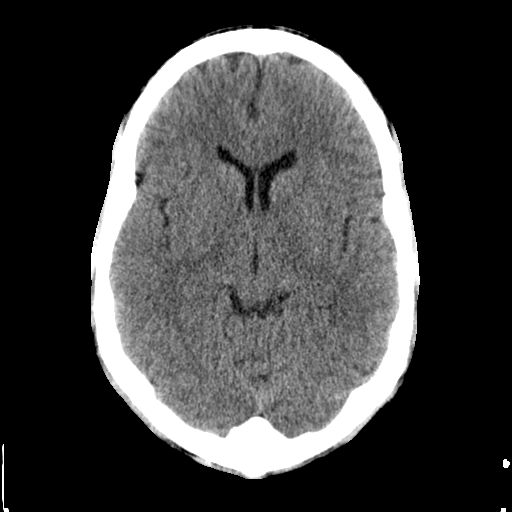
[im 15/36  brain]
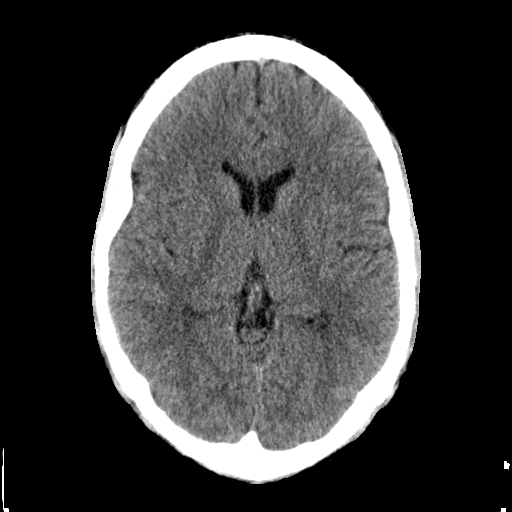
[im 17/36  brain]
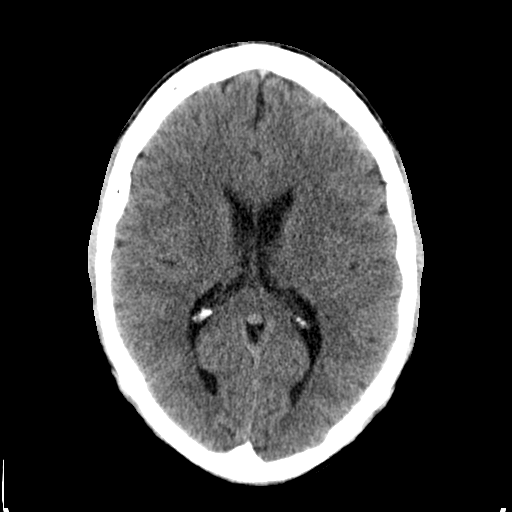
[im 19/36  brain]
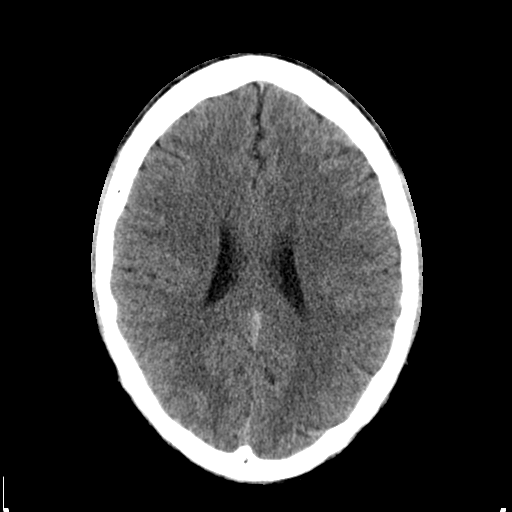
[im 19/36  bone]
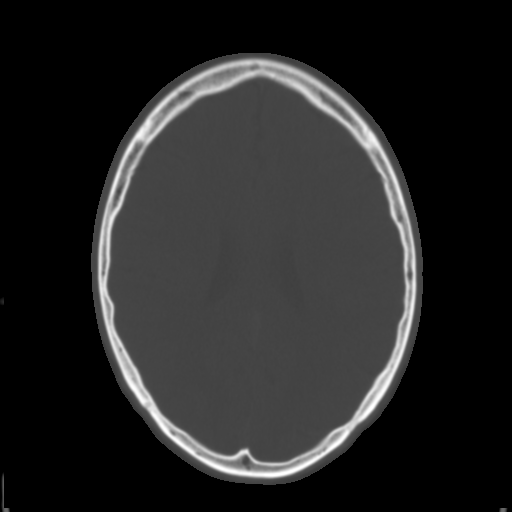
[im 21/36  brain]
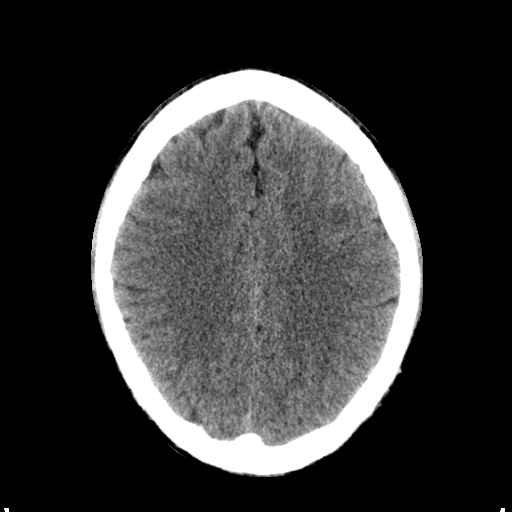
[im 23/36  brain]
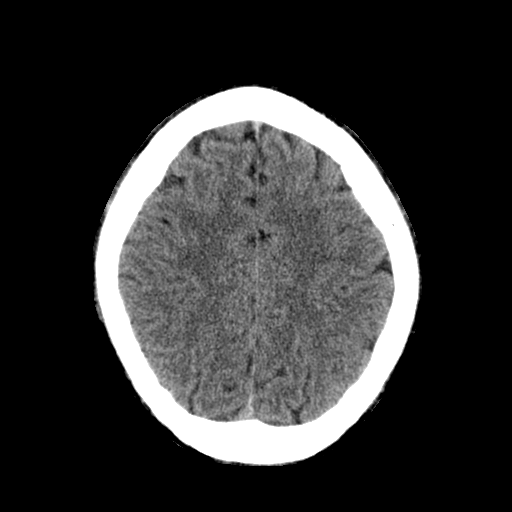
[im 26/36  brain]
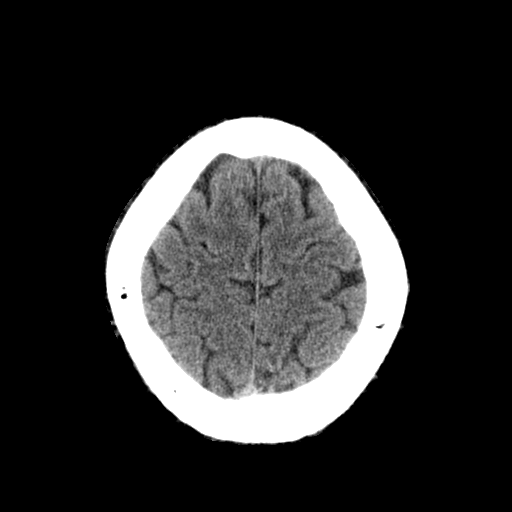
[im 27/36  brain]
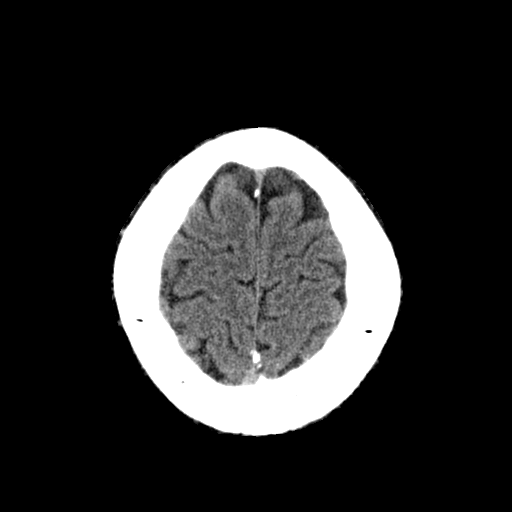
[im 27/36  bone]
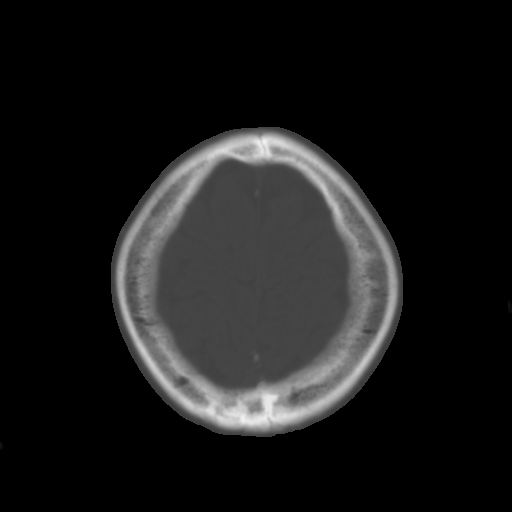
[im 29/36  brain]
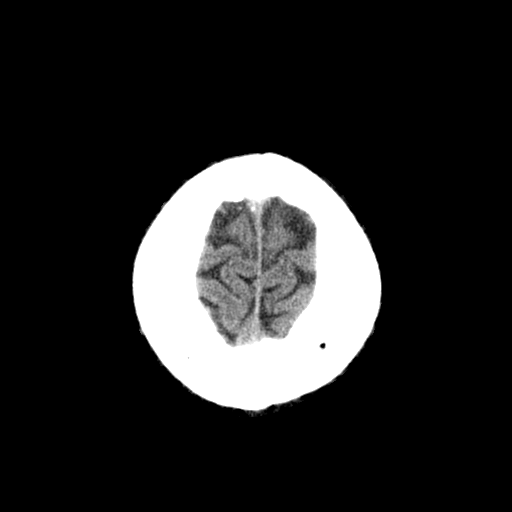
[im 32/36  brain]
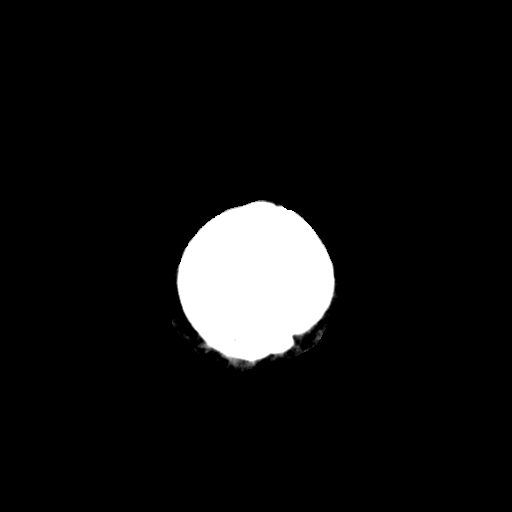
[im 34/36  brain]
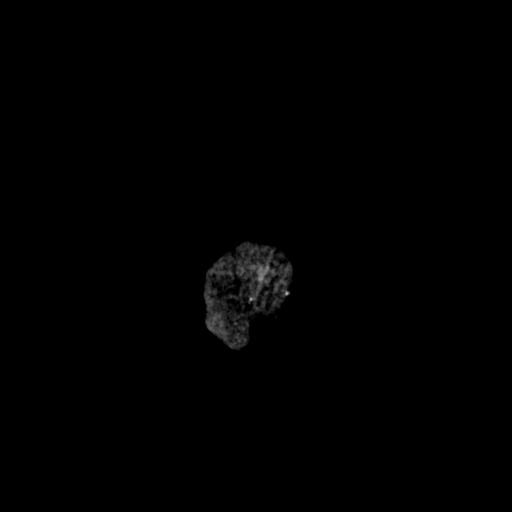

[16 of 30 positions shown; findings below may reference images not displayed]

FINDINGS: There is no evidence of acute infarction, mass lesion, or intra- or
extra-axial hemorrhage on CT.

The posterior fossa, including the cerebellum, brainstem and fourth
ventricle, is within normal limits. The third and lateral
ventricles, and basal ganglia are unremarkable in appearance. The
cerebral hemispheres are symmetric in appearance, with normal
gray-white differentiation. No mass effect or midline shift is seen.

There is no evidence of fracture; visualized osseous structures are
unremarkable in appearance. The visualized portions of the orbits
are within normal limits. The paranasal sinuses and mastoid air
cells are well-aerated. No significant soft tissue abnormalities are
seen.
IMPRESSION: Unremarkable noncontrast CT of the head.

## 2017-06-06 IMAGING — DX DG SHOULDER 2+V*R*
3 series · 3 of 3 positions shown · non-contrast
Comparison: None.

CLINICAL DATA: Fall 3 days ago playing basketball with right
shoulder pain. Initial encounter.

EXAM:
RIGHT SHOULDER - 2+ VIEW

[shoulder grashey]
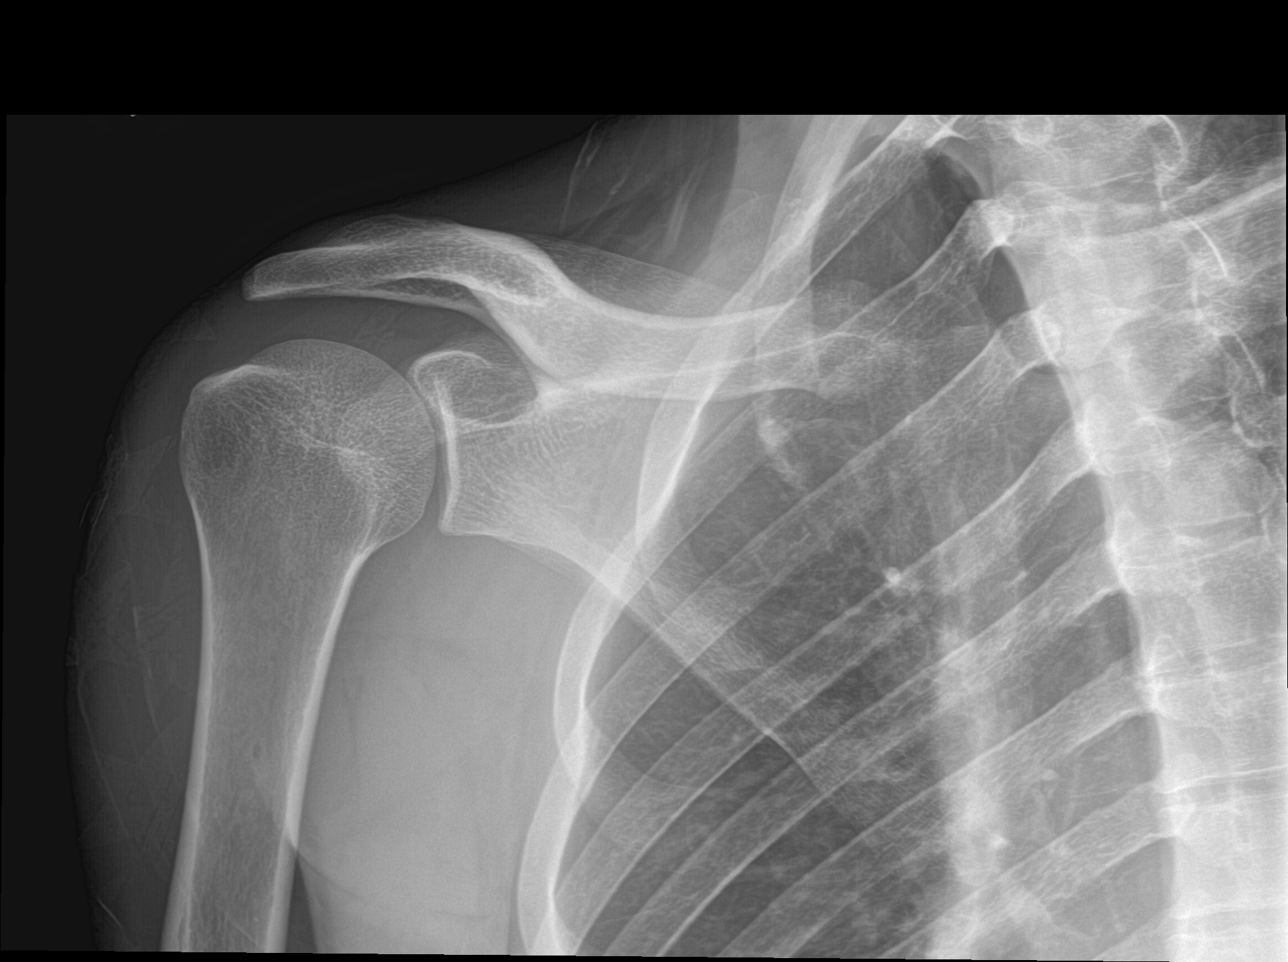

[shoulder y view]
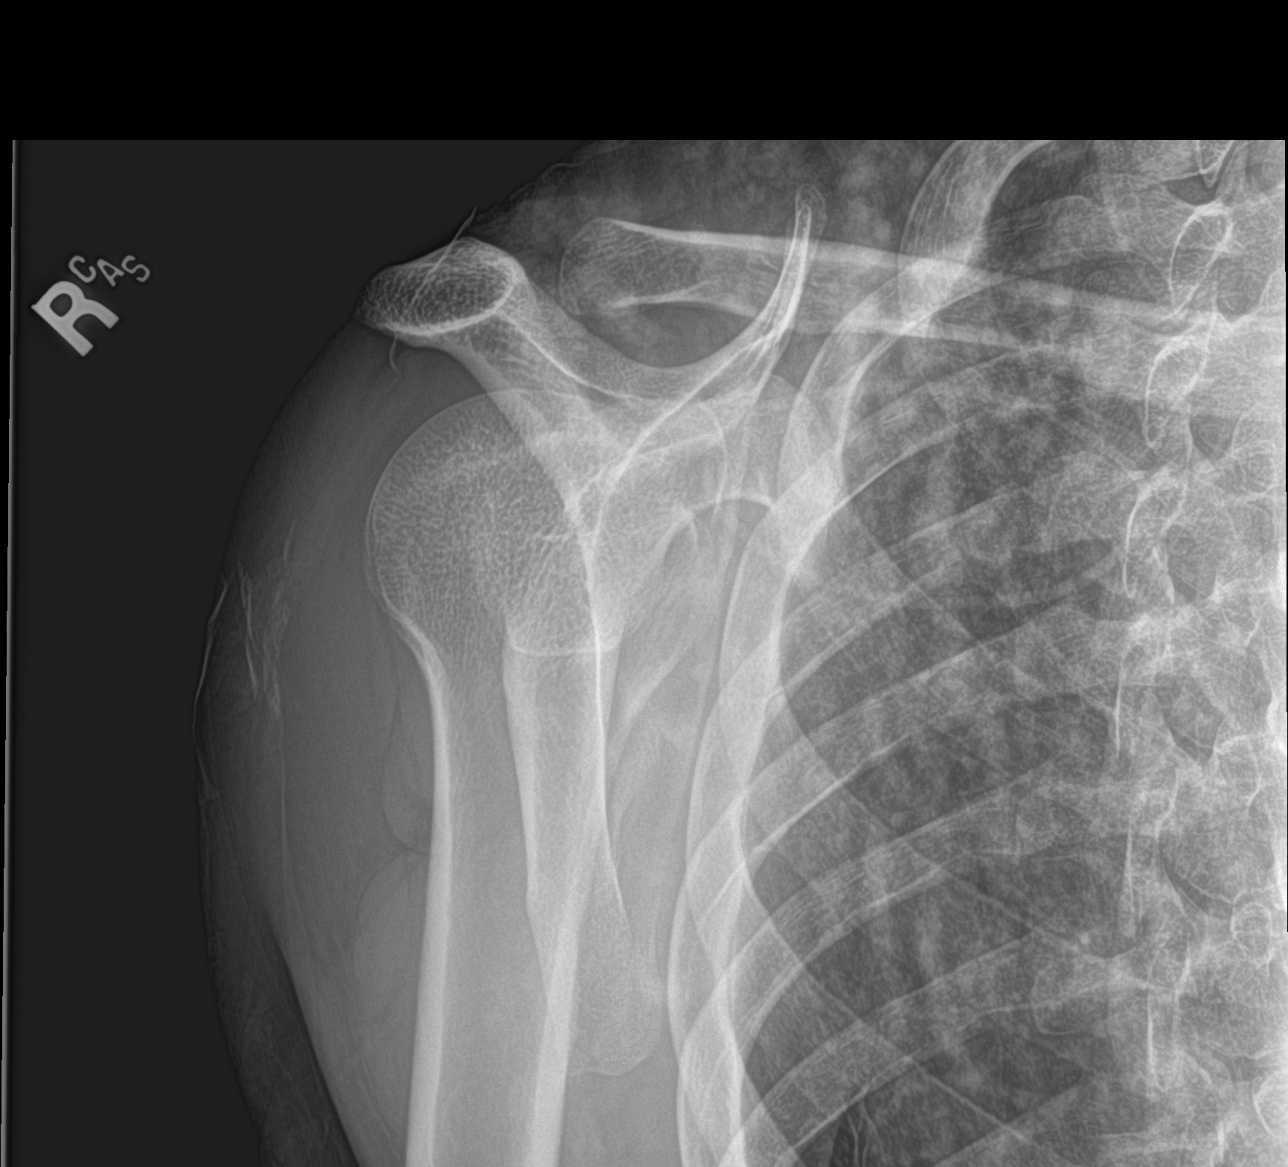

[shoulder axillary]
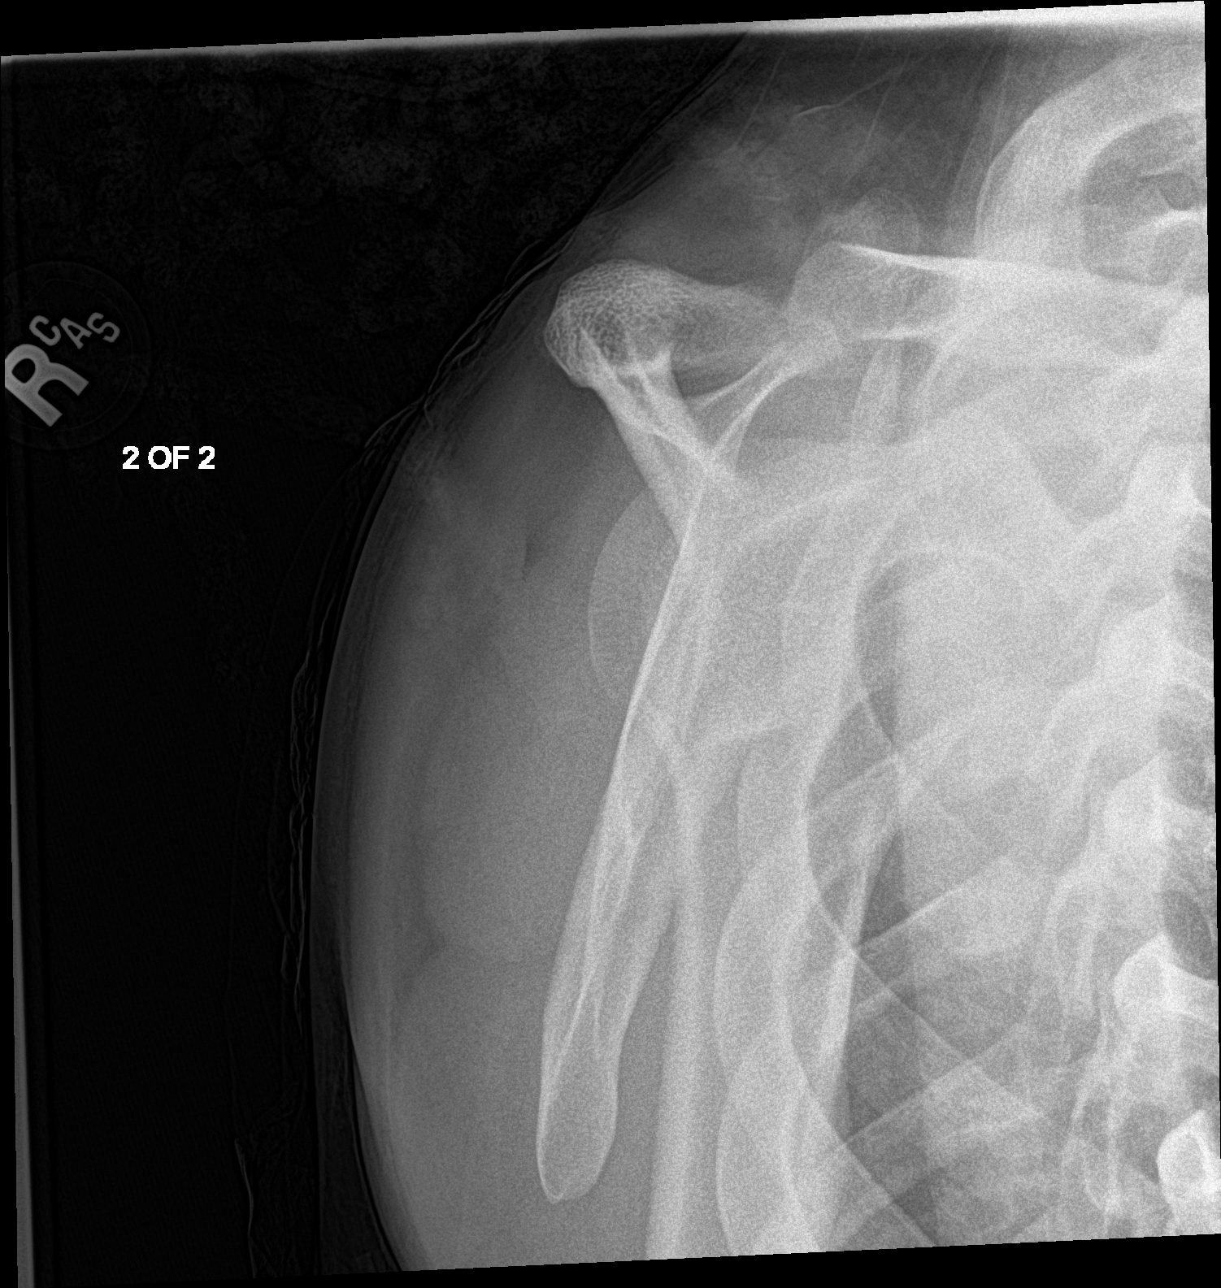

[3 of 3 positions shown; findings below may reference images not displayed]

FINDINGS: There is no evidence of fracture or dislocation. There is no
evidence of arthropathy or other focal bone abnormality. Soft
tissues are unremarkable.
IMPRESSION: Negative.

## 2018-09-16 ENCOUNTER — Emergency Department
Admission: EM | Admit: 2018-09-16 | Discharge: 2018-09-16 | Disposition: A | Discharge status: Home / Self Care | Payer: Self-pay | Attending: Emergency Medicine | Admitting: Emergency Medicine

## 2018-09-16 ENCOUNTER — Encounter: Payer: Self-pay | Admitting: Emergency Medicine

## 2018-09-16 ENCOUNTER — Other Ambulatory Visit: Payer: Self-pay

## 2018-09-16 DIAGNOSIS — K047 Periapical abscess without sinus: Secondary | ICD-10-CM | POA: Insufficient documentation

## 2018-09-16 MED ORDER — ONDANSETRON 8 MG PO TBDP
8.0000 mg | ORAL_TABLET | Freq: Once | ORAL | Status: AC
  Administered 2018-09-16: 8 mg via ORAL
  Filled 2018-09-16: qty 1

## 2018-09-16 MED ORDER — LIDOCAINE VISCOUS HCL 2 % MT SOLN
15.0000 mL | Freq: Once | OROMUCOSAL | Status: AC
  Administered 2018-09-16: 15 mL via OROMUCOSAL
  Filled 2018-09-16: qty 15

## 2018-09-16 MED ORDER — AMOXICILLIN 500 MG PO CAPS: 500.0000 mg | ORAL_CAPSULE | Freq: Three times a day (TID) | ORAL | 0 refills | Status: DC

## 2018-09-16 MED ORDER — LIDOCAINE VISCOUS HCL 2 % MT SOLN: 5.0000 mL | Freq: Four times a day (QID) | OROMUCOSAL | 0 refills | Status: DC | PRN

## 2018-09-16 MED ORDER — OXYCODONE-ACETAMINOPHEN 7.5-325 MG PO TABS: 1.0000 | ORAL_TABLET | Freq: Four times a day (QID) | ORAL | 0 refills | Status: DC | PRN

## 2018-09-16 NOTE — ED Provider Notes (Signed)
[facility] Emergency Department Provider Note    ____________________________________________   First MD Initiated Contact with Patient 09/16/18 1506     (approximate)  I have reviewed the triage vital signs and the nursing notes.    HISTORY  Chief Complaint Dental Pain    HPI Alejandro Cameron is a 28 y.o. male patient complain of pain and edema to the right upper palate secondary to a dental abscess.  Patient denies fever associated with this complaint.  Patient state has a longtime history of dental problem has not been able to afford a dentist.  Patient states problem goes into remission with antibiotic within flares back up again.   Patient rates his pain as a 8/10.  Patient described the pain as "achy".  No relief over-the-counter Tylenol ibuprofen.   History reviewed. No pertinent past medical history.  There are no active problems to display for this patient.   History reviewed. No pertinent surgical history.  Prior to Admission medications   Medication Sig Start Date End Date Taking? Authorizing Provider  amoxicillin (AMOXIL) 500 MG capsule Take 1 capsule (500 mg total) by mouth 3 (three) times daily. 09/16/18   Joni ReiningSmith, Masao Junker K, PA-C  diazepam (VALIUM) 5 MG tablet Take 1 tablet (5 mg total) by mouth 2 (two) times daily. 12/24/15   Melton Krebsiley, Samantha Nicole, PA-C  lidocaine (XYLOCAINE) 2 % solution Use as directed 5 mLs in the mouth or throat every 6 (six) hours as needed for mouth pain. For oral swish 09/16/18   Joni ReiningSmith, Eliga Arvie K, PA-C  naproxen (NAPROSYN) 500 MG tablet Take 1 tablet (500 mg total) by mouth 2 (two) times daily. 12/24/15   Melton Krebsiley, Samantha Nicole, PA-C  oxyCODONE-acetaminophen (PERCOCET) 7.5-325 MG tablet Take 1 tablet by mouth every 6 (six) hours as needed. 09/16/18   Joni ReiningSmith, Clarise Chacko K, PA-C    Allergies Ibuprofen  History reviewed. No pertinent family history.  Social History Social History   Tobacco Use  . Smoking status: Never  Smoker  . Smokeless tobacco: Never Used  Substance Use Topics  . Alcohol use: No  . Drug use: No    Review of Systems Constitutional: No fever/chills Eyes: No visual changes. ENT: No sore throat.  Dental pain right upper molar. Cardiovascular: Denies chest pain. Respiratory: Denies shortness of breath. Gastrointestinal: No abdominal pain.  No nausea, no vomiting.  No diarrhea.  No constipation. Genitourinary: Negative for dysuria. Musculoskeletal: Negative for back pain. Skin: Negative for rash. Neurological: Negative for headaches, focal weakness or numbness. Allergic/Immunilogical: Ibuprofen.  ____________________________________________   PHYSICAL EXAM:  VITAL SIGNS: ED Triage Vitals  Enc Vitals Group     BP 09/16/18 1455 110/61     Pulse Rate 09/16/18 1455 84     Resp 09/16/18 1455 14     Temp 09/16/18 1455 98.9 F (37.2 C)     Temp Source 09/16/18 1455 Oral     SpO2 09/16/18 1455 97 %     Weight 09/16/18 1457 170 lb (77.1 kg)     Height 09/16/18 1457 6\' 2"  (1.88 m)     Head Circumference --      Peak Flow --      Pain Score 09/16/18 1456 8     Pain Loc --      Pain Edu? --      Excl. in GC? --    Constitutional: Alert and oriented. Well appearing and in no acute distress.  Mouth/Throat: Mucous membranes are moist.  Oropharynx non-erythematous.  Edematous erythematous gingiva around tooth #29. Neck: No stridor.   Cardiovascular: Normal rate, regular rhythm. Grossly normal heart sounds.  Good peripheral circulation. Respiratory: Normal respiratory effort.  No retractions. Lungs CTAB.  ____________________________________________   LABS (all labs ordered are listed, but only abnormal results are displayed)  Labs Reviewed - No data to display ____________________________________________  EKG   ____________________________________________  RADIOLOGY  ED MD interpretation:    Official radiology report(s): No results  found.  ____________________________________________   PROCEDURES  Procedure(s) performed: None  Procedures  Critical Care performed: No  ____________________________________________   INITIAL IMPRESSION / ASSESSMENT AND PLAN / ED COURSE  As part of my medical decision making, I reviewed the following data within the electronic MEDICAL RECORD NUMBER     Dental pain secondary to caries, gingivitis, and small dental abscess.  Discussed with patient rationale for seeking dental care.  Patient given a list of dental clinics for follow-up.  Patient get a prescription for amoxicillin, Percocet, and viscous lidocaine.      ____________________________________________   FINAL CLINICAL IMPRESSION(S) / ED DIAGNOSES  Final diagnoses:  Dental abscess     ED Discharge Orders         Ordered    amoxicillin (AMOXIL) 500 MG capsule  3 times daily     09/16/18 1519    oxyCODONE-acetaminophen (PERCOCET) 7.5-325 MG tablet  Every 6 hours PRN     09/16/18 1519    lidocaine (XYLOCAINE) 2 % solution  Every 6 hours PRN     09/16/18 1519           Note:  This document was prepared using Dragon voice recognition software and may include unintentional dictation errors.    Joni Reining, PA-C 09/16/18 1538    Schaevitz, Myra Rude, MD 09/16/18 [phone]

## 2018-09-16 NOTE — ED Triage Notes (Signed)
Pt has visible swelling to upper hard palate next to molar with decay.  No fevers.  Pt has had this intermittent problem but has not seen dentist r/t insurance problems. Keeps needing abx.  Will drain in mouth sometimes.

## 2018-09-16 NOTE — ED Notes (Signed)
See triage note   Pt has visible swelling to upper hard palate next to molar with decay.  No fevers.  Pt has had this intermittent problem but has not seen dentist r/t insurance problems. Keeps needing abx.  Will drain in mouth sometimes.     14:55 Chief Complaints Updated

## 2018-09-16 NOTE — Discharge Instructions (Addendum)
Follow-up from list of dental clinics provided °

## 2019-01-10 ENCOUNTER — Emergency Department: Payer: No Typology Code available for payment source

## 2019-01-10 ENCOUNTER — Encounter: Payer: Self-pay | Admitting: Emergency Medicine

## 2019-01-10 ENCOUNTER — Emergency Department
Admission: EM | Admit: 2019-01-10 | Discharge: 2019-01-10 | Disposition: A | Discharge status: Home / Self Care | Payer: No Typology Code available for payment source | Attending: Emergency Medicine | Admitting: Emergency Medicine

## 2019-01-10 ENCOUNTER — Other Ambulatory Visit: Payer: Self-pay

## 2019-01-10 DIAGNOSIS — S4991XA Unspecified injury of right shoulder and upper arm, initial encounter: Secondary | ICD-10-CM | POA: Diagnosis present

## 2019-01-10 DIAGNOSIS — Y999 Unspecified external cause status: Secondary | ICD-10-CM | POA: Diagnosis not present

## 2019-01-10 DIAGNOSIS — Y939 Activity, unspecified: Secondary | ICD-10-CM | POA: Diagnosis not present

## 2019-01-10 DIAGNOSIS — K047 Periapical abscess without sinus: Secondary | ICD-10-CM | POA: Diagnosis not present

## 2019-01-10 DIAGNOSIS — Y92410 Unspecified street and highway as the place of occurrence of the external cause: Secondary | ICD-10-CM | POA: Insufficient documentation

## 2019-01-10 DIAGNOSIS — S40021A Contusion of right upper arm, initial encounter: Secondary | ICD-10-CM | POA: Insufficient documentation

## 2019-01-10 MED ORDER — AMOXICILLIN 500 MG PO CAPS: 500.0000 mg | ORAL_CAPSULE | Freq: Three times a day (TID) | ORAL | 0 refills | Status: DC

## 2019-01-10 MED ORDER — TRAMADOL HCL 50 MG PO TABS
50.0000 mg | ORAL_TABLET | Freq: Once | ORAL | Status: AC
Start: 2019-01-10 — End: 2019-01-10
  Administered 2019-01-10: 50 mg via ORAL
  Filled 2019-01-10: qty 1

## 2019-01-10 MED ORDER — BACLOFEN 10 MG PO TABS: 10.0000 mg | ORAL_TABLET | Freq: Three times a day (TID) | ORAL | 1 refills | Status: AC

## 2019-01-10 MED ORDER — TRAMADOL HCL 50 MG PO TABS: 50.0000 mg | ORAL_TABLET | Freq: Four times a day (QID) | ORAL | 0 refills | Status: DC | PRN
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
## 2019-03-14 ENCOUNTER — Other Ambulatory Visit: Payer: Self-pay

## 2019-03-14 ENCOUNTER — Ambulatory Visit: Payer: Self-pay | Admitting: Physician Assistant

## 2019-03-14 ENCOUNTER — Encounter: Payer: Self-pay | Admitting: Physician Assistant

## 2019-03-14 DIAGNOSIS — R21 Rash and other nonspecific skin eruption: Secondary | ICD-10-CM

## 2019-03-14 DIAGNOSIS — Z113 Encounter for screening for infections with a predominantly sexual mode of transmission: Secondary | ICD-10-CM

## 2019-03-14 LAB — HM HIV SCREENING LAB: HM HIV Screening: NEGATIVE

## 2019-03-14 LAB — GRAM STAIN

## 2019-03-14 NOTE — Progress Notes (Signed)
    STI clinic/screening visit  Subjective:  Alejandro Cameron is a 28 y.o. male being seen today for an STI screening visit. The patient reports they do not have symptoms.  Patient has the following medical conditions:  There are no active problems to display for this patient.    Chief Complaint  Patient presents with  . SEXUALLY TRANSMITTED DISEASE    HPI  Patient reports that he has no genital symptoms.  Reports that he had a boil I&D last week and is not taking any antibiotics.  Reports that he has noticed the rash/hives on neck, chest, arms, and back for about 2 weeks.  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   See flowsheet for further details and programmatic requirements.    The following portions of the patient's history were reviewed and updated as appropriate: allergies, current medications, past medical history, past social history, past surgical history and problem list.  Objective:  There were no vitals filed for this visit.  Physical Exam Constitutional:      General: He is not in acute distress.    Appearance: Normal appearance.  HENT:     Head: Normocephalic and atraumatic.     Mouth/Throat:     Mouth: Mucous membranes are moist.     Pharynx: Oropharynx is clear. No oropharyngeal exudate or posterior oropharyngeal erythema.  Neck:     Musculoskeletal: Neck supple.  Pulmonary:     Effort: Pulmonary effort is normal.  Abdominal:     Palpations: Abdomen is soft. There is no mass.     Tenderness: There is no abdominal tenderness. There is no guarding.  Genitourinary:    Penis: Normal.      Scrotum/Testes: Normal.     Comments: Pubic area without nits, lice, edema, erythema, lesions and inguinal adenopathy. Penis is circumcised without any discharge at meatus. R inguinal fold with healed area where I & D performed.  Lymphadenopathy:     Cervical: No cervical  adenopathy.  Skin:    General: Skin is warm and dry.     Findings: Rash present. No bruising, erythema or lesion.     Comments: Neck, chest, abdomen, arms and back with some hyperpigmented, some scaling, and some papular areas, nt, no ulcerations, rash is scattered and shows some signs of excoriation.  Neurological:     Mental Status: He is alert and oriented to person, place, and time.  Psychiatric:        Mood and Affect: Mood normal.        Thought Content: Thought content normal.        Judgment: Judgment normal.       Assessment and Plan:  [name] is a 28 y.o. male presenting to the [facility] Department for STI screening  1. Screening for STD (sexually transmitted disease) Patient is without genital symptoms today. Rec condoms with all sex Await test results. - Gram stain - Gonococcus culture - [facility] - Syphilis Serology, [facility]  2.  Rash Likely contact dermatitis and recommend that patient see PCP or Dermatology for evaluation. Rec to use non-sedating antihistamine to relieve itching Rec cool bath or shower with oatmeal bath/soap      No follow-ups on file.  No future appointments.  Jerene Dilling, PA

## 2019-03-19 LAB — GONOCOCCUS CULTURE

## 2019-12-01 ENCOUNTER — Emergency Department
Admission: EM | Admit: 2019-12-01 | Discharge: 2019-12-01 | Disposition: A | Discharge status: Home / Self Care | Payer: Self-pay | Attending: Emergency Medicine | Admitting: Emergency Medicine

## 2019-12-01 ENCOUNTER — Other Ambulatory Visit: Payer: Self-pay

## 2019-12-01 ENCOUNTER — Encounter: Payer: Self-pay | Admitting: Emergency Medicine

## 2019-12-01 DIAGNOSIS — Z79899 Other long term (current) drug therapy: Secondary | ICD-10-CM | POA: Insufficient documentation

## 2019-12-01 DIAGNOSIS — K0889 Other specified disorders of teeth and supporting structures: Secondary | ICD-10-CM | POA: Insufficient documentation

## 2019-12-01 DIAGNOSIS — F17210 Nicotine dependence, cigarettes, uncomplicated: Secondary | ICD-10-CM | POA: Insufficient documentation

## 2019-12-01 MED ORDER — LIDOCAINE VISCOUS HCL 2 % MT SOLN: 15.0000 mL | OROMUCOSAL | 0 refills | Status: DC | PRN

## 2019-12-01 MED ORDER — CLINDAMYCIN HCL 300 MG PO CAPS: 300.0000 mg | ORAL_CAPSULE | Freq: Three times a day (TID) | ORAL | 0 refills | Status: AC

## 2019-12-01 MED ORDER — HYDROCODONE-ACETAMINOPHEN 5-325 MG PO TABS: 1.0000 | ORAL_TABLET | Freq: Four times a day (QID) | ORAL | 0 refills | Status: AC | PRN

## 2019-12-01 NOTE — ED Triage Notes (Signed)
Patient had several teeth removed to the right back of his mouth 3 weeks ago.  Patient states he had bleeding to his mouth/gums for several weeks.  Patient reports pain with eating.  Patient states he went back to the dentist a week ago and was told to take tylenol and ibuprofen.  Patient states tylenol and ibuprofen make patient feel nauseous.  Patient states he had another dental appointment today for a cleaning, but they were unable to do the cleaning because of the gum pain and swelling.  Patient states he was not treated well at the dentist.

## 2019-12-01 NOTE — ED Notes (Signed)
Pt states he has pain and swelling in right back of mouth after teeth extraction 3 weeks ago. Pt in room conversing with EDP. Pt states he was unable to get teeth cleaned due to pain and swelling.

## 2019-12-01 NOTE — Discharge Instructions (Signed)
Use the lidocaine before brushing your teeth.  Take the Norco for severe pain.  Follow up with the dentist of your choice for symptoms that are not improving over the week.  Return to the ER for symptoms that change or worsen if unable to schedule an appointment.

## 2019-12-01 NOTE — ED Provider Notes (Signed)
[facility] Emergency Department Provider Note ____________________________________________  Time seen: Approximately 3:43 PM  I have reviewed the triage vital signs and the nursing notes.   HISTORY  Chief Complaint Dental Pain   HPI LOVEL SUAZO is a 29 y.o. male with no chronic medical history presents to the emergency department due to dental pain. He states that about 3 weeks ago he had 3 teeth extracted and has since had gum pain, swelling, and bleeding. He followed up with the dentist who told him to take tylenol and ibuprofen. He has had no relief. He went back to the dentist today for a dental cleaning, but was unable to tolerate it due to pain.   History reviewed. No pertinent past medical history.  There are no problems to display for this patient.   Past Surgical History:  Procedure Laterality Date  . DENTAL SURGERY      Prior to Admission medications   Medication Sig Start Date End Date Taking? Authorizing Provider  amoxicillin (AMOXIL) 500 MG capsule Take 1 capsule (500 mg total) by mouth 3 (three) times daily. 01/10/19   Fisher, Linden Dolin, PA-C  baclofen (LIORESAL) 10 MG tablet Take 1 tablet (10 mg total) by mouth 3 (three) times daily. 01/10/19 01/10/20  Fisher, Linden Dolin, PA-C  traMADol (ULTRAM) 50 MG tablet Take 1 tablet (50 mg total) by mouth every 6 (six) hours as needed. 01/10/19   Versie Starks, PA-C    Allergies Ibuprofen  No family history on file.  Social History Social History   Tobacco Use  . Smoking status: Current Some Day Smoker    Types: Cigars  . Smokeless tobacco: Never Used  Substance Use Topics  . Alcohol use: No  . Drug use: No    Review of Systems Constitutional: Negative for fever or recent illness. ENT: Positive for dental pain. Musculoskeletal: Negative for trismus of the jaw.  Skin: Negative for wound or lesion. ____________________________________________   PHYSICAL EXAM:  VITAL SIGNS: ED Triage  Vitals  Enc Vitals Group     BP 12/01/19 1359 139/69     Pulse Rate 12/01/19 1359 89     Resp 12/01/19 1359 16     Temp 12/01/19 1359 98.9 F (37.2 C)     Temp Source 12/01/19 1359 Oral     SpO2 12/01/19 1359 98 %     Weight 12/01/19 1400 165 lb (74.8 kg)     Height 12/01/19 1400 6\' 2"  (1.88 m)     Head Circumference --      Peak Flow --      Pain Score 12/01/19 1400 9     Pain Loc --      Pain Edu? --      Excl. in [place]? --     Constitutional: Alert and oriented. Well appearing and in no acute distress. Eyes: Conjunctiva are clear without discharge or drainage. Mouth/Throat: 3 Periodontal Exam Hematological/Lymphatic/Immunilogical: No palpable anterior cervical adenopathy.  Respiratory: Respirations even and unlabored. Musculoskeletal: Full ROM of the jaw. Neurologic: Awake, alert, oriented.  Skin:  Gingival edema and erythema surrounding bottom front and molars Psychiatric: Affect and behavior intact.  ____________________________________________   LABS (all labs ordered are listed, but only abnormal results are displayed)  Labs Reviewed - No data to display ____________________________________________   RADIOLOGY  Not indicated. ____________________________________________   PROCEDURES  Procedure(s) performed:   Procedures  Critical Care performed: No ____________________________________________   INITIAL IMPRESSION / ASSESSMENT AND PLAN / ED COURSE  ELY BALLEN is a 29 y.o. male presents to the ER for dental pain. Gingival edema noted on exam. Plan will be to treat with antibiotics, lidocaine, and pain meds. He is to follow up with the dentist for his dental cleaning and x-rays after tenderness has resolved. He is to return to the ER for symptoms of concern if unable to schedule an appointment.   Pertinent labs & imaging results that were available during my care of the patient were reviewed by me and considered in my medical decision making (see chart  for details).  ____________________________________________   FINAL CLINICAL IMPRESSION(S) / ED DIAGNOSES  Final diagnoses:  None    New Prescriptions   No medications on file    If controlled substance prescribed during this visit, 12 month history viewed on the [place] prior to issuing an initial prescription for Schedule II or III opiod.  Note:  This document was prepared using Dragon voice recognition software and may include unintentional dictation errors.   Chinita Pester, FNP 12/01/19 Eldridge Dace    Shaune Pollack, MD 12/06/19 1005

## 2020-04-28 ENCOUNTER — Other Ambulatory Visit: Payer: Self-pay

## 2020-04-28 ENCOUNTER — Emergency Department
Admission: EM | Admit: 2020-04-28 | Discharge: 2020-04-28 | Disposition: A | Discharge status: Home / Self Care | Payer: Self-pay | Attending: Emergency Medicine | Admitting: Emergency Medicine

## 2020-04-28 DIAGNOSIS — F17290 Nicotine dependence, other tobacco product, uncomplicated: Secondary | ICD-10-CM | POA: Insufficient documentation

## 2020-04-28 DIAGNOSIS — B070 Plantar wart: Secondary | ICD-10-CM | POA: Insufficient documentation

## 2020-04-28 MED ORDER — SALICYLIC ACID 17 % EX GEL: Freq: Every day | CUTANEOUS | 0 refills | Status: DC

## 2020-04-28 NOTE — ED Notes (Signed)
See triage note- pt states he has had ongoing bilateral foot pain and swelling for at least 2 months. Pt wears steel toes at work and he believes this is the cause. Pt states he has been soaking them in epsom salts but this hasn't help.

## 2020-04-28 NOTE — ED Provider Notes (Signed)
Emergency Department Provider Note  ____________________________________________  Time seen: Approximately 4:15 PM  I have reviewed the triage vital signs and the nursing notes.   HISTORY  Chief Complaint swollen feet   Historian Patient     HPI Alejandro Cameron is a 29 y.o. male presents to the emergency department for bilateral feet pain.  Patient rates his pain 8 out of 10 in intensity and localizes pain to calluses along left plantar aspect of his foot.  He states that the lesions have been present for the past several months but he has experienced worsening pain from his boots.  No alleviating measures have been attempted.   History reviewed. No pertinent past medical history.   Immunizations up to date:  Yes.     History reviewed. No pertinent past medical history.  There are no problems to display for this patient.   Past Surgical History:  Procedure Laterality Date  . DENTAL SURGERY      Prior to Admission medications   Medication Sig Start Date End Date Taking? Authorizing Provider  lidocaine (XYLOCAINE) 2 % solution Use as directed 15 mLs in the mouth or throat as needed for mouth pain. 12/01/19   Triplett, Rulon Eisenmenger B, FNP  salicylic acid 17 % gel Apply topically daily. Apply gel to cotton ball and tape cottonball in place.  Change cotton ball every 48 hours.  Repeat treatment every 2 days for the next 12 weeks. 04/28/20   Orvil Feil, PA-C    Allergies Ibuprofen  History reviewed. No pertinent family history.  Social History Social History   Tobacco Use  . Smoking status: Current Some Day Smoker    Types: Cigars  . Smokeless tobacco: Never Used  Substance Use Topics  . Alcohol use: No  . Drug use: No     Review of Systems  Constitutional: No fever/chills Eyes:  No discharge ENT: No upper respiratory complaints. Respiratory: no cough. No SOB/ use of accessory muscles to breath Gastrointestinal:   No nausea, no vomiting.  No diarrhea.  No  constipation. Musculoskeletal: Negative for musculoskeletal pain. Skin: Patient has plantar warts.     ____________________________________________   PHYSICAL EXAM:  VITAL SIGNS: ED Triage Vitals [04/28/20 1426]  Enc Vitals Group     BP (!) 99/52     Pulse Rate 68     Resp 18     Temp 98.2 F (36.8 C)     Temp Source Oral     SpO2 100 %     Weight 185 lb (83.9 kg)     Height 6\' 2"  (1.88 m)     Head Circumference      Peak Flow      Pain Score 7     Pain Loc      Pain Edu?      Excl. in GC?      Constitutional: Alert and oriented. Well appearing and in no acute distress. Eyes: Conjunctivae are normal. PERRL. EOMI. Head: Atraumatic. Cardiovascular: Normal rate, regular rhythm. Normal S1 and S2.  Good peripheral circulation. Respiratory: Normal respiratory effort without tachypnea or retractions. Lungs CTAB. Good air entry to the bases with no decreased or absent breath sounds Gastrointestinal: Bowel sounds x 4 quadrants. Soft and nontender to palpation. No guarding or rigidity. No distention. Musculoskeletal: Full range of motion to all extremities. No obvious deformities noted Neurologic:  Normal for age. No gross focal neurologic deficits are appreciated.  Skin: Patient has 3 plantar warts visualized along the plantar aspect  of the bilateral feet.   ____________________________________________   LABS (all labs ordered are listed, but only abnormal results are displayed)  Labs Reviewed - No data to display ____________________________________________  EKG   ____________________________________________  RADIOLOGY   No results found.  ____________________________________________    PROCEDURES  Procedure(s) performed:     Procedures     Medications - No data to display   ____________________________________________   INITIAL IMPRESSION / ASSESSMENT AND PLAN / ED COURSE  Pertinent labs & imaging results that were available during my care of  the patient were reviewed by me and considered in my medical decision making (see chart for details).      Assessment and plan Plantar warts 29 year old male presents to the emergency department for plantar wart treatment.  He was discharged with salicylic acid.  Return precautions were given to return with new or worsening symptoms.  All patient questions were answered.    ____________________________________________  FINAL CLINICAL IMPRESSION(S) / ED DIAGNOSES  Final diagnoses:  Plantar warts      NEW MEDICATIONS STARTED DURING THIS VISIT:  ED Discharge Orders         Ordered    salicylic acid 17 % gel  Daily        04/28/20 1612              This chart was dictated using voice recognition software/Dragon. Despite best efforts to proofread, errors can occur which can change the meaning. Any change was purely unintentional.     Orvil Feil, PA-C 04/28/20 1617    Shaune Pollack, MD 05/03/20 [phone]

## 2020-04-28 NOTE — ED Triage Notes (Signed)
Bilateral feet swelling and pain after standing on them all day at work

## 2020-09-04 ENCOUNTER — Emergency Department: Payer: Medicaid Other

## 2020-09-04 ENCOUNTER — Emergency Department
Admission: EM | Admit: 2020-09-04 | Discharge: 2020-09-04 | Disposition: A | Discharge status: Home / Self Care | Payer: Medicaid Other | Attending: Emergency Medicine | Admitting: Emergency Medicine

## 2020-09-04 ENCOUNTER — Other Ambulatory Visit: Payer: Self-pay

## 2020-09-04 DIAGNOSIS — R1032 Left lower quadrant pain: Secondary | ICD-10-CM

## 2020-09-04 DIAGNOSIS — F17290 Nicotine dependence, other tobacco product, uncomplicated: Secondary | ICD-10-CM | POA: Insufficient documentation

## 2020-09-04 LAB — COMPREHENSIVE METABOLIC PANEL
ALT: 20 U/L (ref 0–44)
AST: 24 U/L (ref 15–41)
Albumin: 4.8 g/dL (ref 3.5–5.0)
Alkaline Phosphatase: 66 U/L (ref 38–126)
Anion gap: 8 (ref 5–15)
BUN: 10 mg/dL (ref 6–20)
CO2: 31 mmol/L (ref 22–32)
Calcium: 9.8 mg/dL (ref 8.9–10.3)
Chloride: 99 mmol/L (ref 98–111)
Creatinine, Ser: 0.82 mg/dL (ref 0.61–1.24)
GFR, Estimated: >60 mL/min (ref >60)
Glucose, Bld: 111 mg/dL — ABNORMAL HIGH (ref 70–99)
Potassium: 4.1 mmol/L (ref 3.5–5.1)
Sodium: 138 mmol/L (ref 135–145)
Total Bilirubin: 1 mg/dL (ref 0.3–1.2)
Total Protein: 9 g/dL — ABNORMAL HIGH (ref 6.5–8.1)

## 2020-09-04 LAB — URINALYSIS, COMPLETE (UACMP) WITH MICROSCOPIC
Bacteria, UA: NONE SEEN
Bilirubin Urine: NEGATIVE
Glucose, UA: NEGATIVE mg/dL
Hgb urine dipstick: NEGATIVE
Ketones, ur: NEGATIVE mg/dL
Leukocytes,Ua: NEGATIVE
Nitrite: NEGATIVE
Protein, ur: NEGATIVE mg/dL
Specific Gravity, Urine: 1.013 (ref 1.005–1.030)
pH: 5 (ref 5.0–8.0)

## 2020-09-04 LAB — CBC
HCT: 42.7 % (ref 39.0–52.0)
Hemoglobin: 14.7 g/dL (ref 13.0–17.0)
MCH: 32 pg (ref 26.0–34.0)
MCHC: 34.4 g/dL (ref 30.0–36.0)
MCV: 92.8 fL (ref 80.0–100.0)
Platelets: 178 10*3/uL (ref 150–400)
RBC: 4.6 MIL/uL (ref 4.22–5.81)
RDW: 13.7 % (ref 11.5–15.5)
WBC: 12.3 10*3/uL — ABNORMAL HIGH (ref 4.0–10.5)
nRBC: 0 % (ref 0.0–0.2)

## 2020-09-04 LAB — LIPASE, BLOOD: Lipase: 23 U/L (ref 11–51)

## 2020-09-04 MED ORDER — NAPROXEN 500 MG PO TABS: 500.0000 mg | ORAL_TABLET | Freq: Two times a day (BID) | ORAL | 2 refills | Status: DC

## 2020-09-04 NOTE — ED Triage Notes (Signed)
Pt c/o LLQ abd pain x 3 days. States it feels like pressure. BM yesterday. Denies urinary symptoms. States some nausea after eating a hotdog yesterday. Denies V&D. Denies fever.   A&O, ambulatory. No distress noted.

## 2020-09-04 NOTE — ED Provider Notes (Signed)
[facility] Emergency Department Provider Note   ____________________________________________    I have reviewed the triage vital signs and the nursing notes.   HISTORY  Chief Complaint Abdominal Pain     HPI Alejandro Cameron is a 30 y.o. male presents with complaints of left lower quadrant abdominal pain.  Patient describes pain seems to be in his abdominal wall but continued for more than a day so decided come to the emergency department a checkup.  No nausea or vomiting.  Normal stools.  No fevers or chills.  Has not take anything for this.  No history of kidney stones, normal urine.  No dysuria.  History reviewed. No pertinent past medical history.  There are no problems to display for this patient.   Past Surgical History:  Procedure Laterality Date  . DENTAL SURGERY      Prior to Admission medications   Medication Sig Start Date End Date Taking? Authorizing Provider  naproxen (NAPROSYN) 500 MG tablet Take 1 tablet (500 mg total) by mouth 2 (two) times daily with a meal. 09/04/20  Yes Jene Every, MD  lidocaine (XYLOCAINE) 2 % solution Use as directed 15 mLs in the mouth or throat as needed for mouth pain. 12/01/19   Triplett, Rulon Eisenmenger B, FNP  salicylic acid 17 % gel Apply topically daily. Apply gel to cotton ball and tape cottonball in place.  Change cotton ball every 48 hours.  Repeat treatment every 2 days for the next 12 weeks. 04/28/20   Orvil Feil, PA-C     Allergies Ibuprofen  History reviewed. No pertinent family history.  Social History Social History   Tobacco Use  . Smoking status: Current Some Day Smoker    Types: Cigars  . Smokeless tobacco: Never Used  Substance Use Topics  . Alcohol use: No  . Drug use: Yes    Types: Marijuana    Review of Systems  Constitutional: No fever/chills Eyes: No visual changes.  ENT: No sore throat. Cardiovascular: Denies chest pain. Respiratory: Denies shortness of  breath. Gastrointestinal: As above Genitourinary: Negative for dysuria.  No hematuria Musculoskeletal: Negative for back pain. Skin: Negative for rash. Neurological: Negative for headaches or weakness   ____________________________________________   PHYSICAL EXAM:  VITAL SIGNS: ED Triage Vitals  Enc Vitals Group     BP 09/04/20 1247 124/71     Pulse Rate 09/04/20 1247 89     Resp 09/04/20 1247 16     Temp 09/04/20 1247 98.9 F (37.2 C)     Temp Source 09/04/20 1247 Oral     SpO2 09/04/20 1247 95 %     Weight 09/04/20 1248 81.2 kg (179 lb)     Height 09/04/20 1248 1.88 m (6\' 2" )     Head Circumference --      Peak Flow --      Pain Score 09/04/20 1247 7     Pain Loc --      Pain Edu? --      Excl. in GC? --     Constitutional: Alert and oriented.   Nose: No congestion/rhinnorhea. Mouth/Throat: Mucous membranes are moist.    Cardiovascular: Normal rate, regular rhythm.  Good peripheral circulation. Respiratory: Normal respiratory effort.  No retractions.  Gastrointestinal: Soft and nontender. No distention.  No CVA tenderness.  Musculoskeletal: No lower extremity tenderness nor edema.  Warm and well perfused Neurologic:  Normal speech and language. No gross focal neurologic deficits are appreciated.  Skin:  Skin is  warm, dry and intact. No rash noted. Psychiatric: Mood and affect are normal. Speech and behavior are normal.  ____________________________________________   LABS (all labs ordered are listed, but only abnormal results are displayed)  Labs Reviewed  COMPREHENSIVE METABOLIC PANEL - Abnormal; Notable for the following components:      Result Value   Glucose, Bld 111 (*)    Total Protein 9.0 (*)    All other components within normal limits  CBC - Abnormal; Notable for the following components:   WBC 12.3 (*)    All other components within normal limits  URINALYSIS, COMPLETE (UACMP) WITH MICROSCOPIC - Abnormal; Notable for the following components:    Color, Urine YELLOW (*)    APPearance CLEAR (*)    All other components within normal limits  LIPASE, BLOOD   ____________________________________________  EKG  None ____________________________________________  RADIOLOGY  CT renal stone study negative for acute abnormality, reviewed by me. ____________________________________________   PROCEDURES  Procedure(s) performed: No  Procedures   Critical Care performed: No ____________________________________________   INITIAL IMPRESSION / ASSESSMENT AND PLAN / ED COURSE  Pertinent labs & imaging results that were available during my care of the patient were reviewed by me and considered in my medical decision making (see chart for details).  Patient presents with left lower quadrant pain as described above, possibility for musculoskeletal abdominal pain given his complaints.  Differential also includes ureterolithiasis, less likely diverticulitis given his age.  No fevers chills nausea or vomiting.  We will send for CT renal stone study to rule out ureterolithiasis, diverticulitis  ED scan is reassuring, patient is feeling well, will discharge with Naprosyn, return precautions discussed otherwise outpatient follow-up as needed.    ____________________________________________   FINAL CLINICAL IMPRESSION(S) / ED DIAGNOSES  Final diagnoses:  Left lower quadrant abdominal pain        Note:  This document was prepared using Dragon voice recognition software and may include unintentional dictation errors.   Jene Every, MD 09/04/20 [phone]

## 2022-05-17 ENCOUNTER — Other Ambulatory Visit: Payer: Self-pay

## 2022-05-17 ENCOUNTER — Emergency Department
Admission: EM | Admit: 2022-05-17 | Discharge: 2022-05-17 | Disposition: A | Discharge status: Home / Self Care | Payer: Medicaid Other | Attending: Emergency Medicine | Admitting: Emergency Medicine

## 2022-05-17 DIAGNOSIS — N390 Urinary tract infection, site not specified: Secondary | ICD-10-CM

## 2022-05-17 DIAGNOSIS — A64 Unspecified sexually transmitted disease: Secondary | ICD-10-CM | POA: Insufficient documentation

## 2022-05-17 LAB — URINALYSIS, COMPLETE (UACMP) WITH MICROSCOPIC
Bilirubin Urine: NEGATIVE
Glucose, UA: NEGATIVE mg/dL
Hgb urine dipstick: NEGATIVE
Ketones, ur: NEGATIVE mg/dL
Nitrite: NEGATIVE
Protein, ur: NEGATIVE mg/dL
Specific Gravity, Urine: 1.013 (ref 1.005–1.030)
WBC, UA: >50 WBC/hpf — ABNORMAL HIGH (ref 0–5)
pH: 5 (ref 5.0–8.0)

## 2022-05-17 LAB — CHLAMYDIA/NGC RT PCR (ARMC ONLY)
Chlamydia Tr: NOT DETECTED
N gonorrhoeae: DETECTED — AB

## 2022-05-17 MED ORDER — DOXYCYCLINE HYCLATE 50 MG PO CAPS: 100.0000 mg | ORAL_CAPSULE | Freq: Two times a day (BID) | ORAL | 0 refills | Status: DC

## 2022-05-17 MED ORDER — CEFTRIAXONE SODIUM 250 MG IJ SOLR
250.0000 mg | Freq: Once | INTRAMUSCULAR | Status: AC
  Administered 2022-05-17: 250 mg via INTRAMUSCULAR
  Filled 2022-05-17: qty 250

## 2022-05-17 MED ORDER — AZITHROMYCIN 1 G PO PACK
1.0000 g | PACK | Freq: Once | ORAL | Status: AC
  Administered 2022-05-17: 1 g via ORAL
  Filled 2022-05-17: qty 1

## 2022-05-17 MED ORDER — ONDANSETRON 4 MG PO TBDP
4.0000 mg | ORAL_TABLET | Freq: Once | ORAL | Status: AC
  Administered 2022-05-17: 4 mg via ORAL
  Filled 2022-05-17: qty 1

## 2022-05-17 NOTE — ED Provider Notes (Signed)
[facility] Provider Note    Event Date/Time   First MD Initiated Contact with Patient 05/17/22 [phone]     (approximate)   History   Penile Discharge   HPI  Alejandro Cameron is a 31 y.o. male who presents to the ED from home with a chief complaint of urethral discharge x2 days.  Denies fever, abdominal pain, nausea/vomiting or dysuria.  Endorses unprotected sexual intercourse 3 days ago with his girlfriend of 5 years.  Denies testicular pain or swelling.     Past Medical History  History reviewed. No pertinent past medical history.   Active Problem List  There are no problems to display for this patient.    Past Surgical History   Past Surgical History:  Procedure Laterality Date   DENTAL SURGERY       Home Medications   Prior to Admission medications   Medication Sig Start Date End Date Taking? Authorizing Provider  doxycycline (VIBRAMYCIN) 50 MG capsule Take 2 capsules (100 mg total) by mouth 2 (two) times daily. 05/17/22  Yes Paulette Blanch, MD  lidocaine (XYLOCAINE) 2 % solution Use as directed 15 mLs in the mouth or throat as needed for mouth pain. 12/01/19   Triplett, Johnette Abraham B, FNP  naproxen (NAPROSYN) 500 MG tablet Take 1 tablet (500 mg total) by mouth 2 (two) times daily with a meal. 09/04/20   Lavonia Drafts, MD  salicylic acid 17 % gel Apply topically daily. Apply gel to cotton ball and tape cottonball in place.  Change cotton ball every 48 hours.  Repeat treatment every 2 days for the next 12 weeks. 04/28/20   Lannie Fields, PA-C     Allergies  Ibuprofen   Family History  No family history on file.   Physical Exam  Triage Vital Signs: ED Triage Vitals  Enc Vitals Group     BP 05/17/22 0136 96/83     Pulse Rate 05/17/22 0136 87     Resp 05/17/22 0136 18     Temp 05/17/22 0136 98.6 F (37 C)     Temp Source 05/17/22 0136 Oral     SpO2 05/17/22 0136 94 %     Weight 05/17/22 0138 175 lb (79.4 kg)     Height 05/17/22 0138 6'  0.5" (1.842 m)     Head Circumference --      Peak Flow --      Pain Score 05/17/22 0138 0     Pain Loc --      Pain Edu? --      Excl. in [place]? --     Updated Vital Signs: BP 96/83 (BP Location: Left Arm)   Pulse 87   Temp 98.6 F (37 C) (Oral)   Resp 18   Ht 6' 0.5" (1.842 m)   Wt 79.4 kg   SpO2 94%   BMI 23.41 kg/m    General: Awake, no distress.  CV:  Good peripheral perfusion.  Resp:  Normal effort.  Abd:  No distention.  Other:  Circumcised male.  No chancres, ulcers or vesicles noted.  Yellow/green urethral discharge noted.  Testicles are descended and nonswollen and nontender.  Strong cremasteric reflexes bilaterally.   ED Results / Procedures / Treatments  Labs (all labs ordered are listed, but only abnormal results are displayed) Labs Reviewed  CHLAMYDIA/NGC RT PCR ([facility] ONLY)           - Abnormal; Notable for the following components:  Result Value   N gonorrhoeae DETECTED (*)    All other components within normal limits  URINALYSIS, COMPLETE (UACMP) WITH MICROSCOPIC - Abnormal; Notable for the following components:   Color, Urine YELLOW (*)    APPearance HAZY (*)    Leukocytes,Ua LARGE (*)    WBC, UA >50 (*)    Bacteria, UA RARE (*)    All other components within normal limits     EKG  None   RADIOLOGY None   Official radiology report(s): No results found.   PROCEDURES:  Critical Care performed: No  Procedures   MEDICATIONS ORDERED IN ED: Medications  cefTRIAXone (ROCEPHIN) injection 250 mg (250 mg Intramuscular Given 05/17/22 0320)  azithromycin (ZITHROMAX) powder 1 g (1 g Oral Given 05/17/22 0320)  ondansetron (ZOFRAN-ODT) disintegrating tablet 4 mg (4 mg Oral Given 05/17/22 0320)     IMPRESSION / MDM / ASSESSMENT AND PLAN / ED COURSE  I reviewed the triage vital signs and the nursing notes.                             31 year old male presenting with urethral discharge.  UA demonstrates large leukocyte with greater than 50  WBC.  Will treat empirically with IM Rocephin and a Zithromax.  Will discharge home on doxycycline.  Patient will follow GC/chlamydia results via MyChart.  Tricked return precautions given.  Patient verbalizes understanding and agrees with plan of care.  Patient's presentation is most consistent with acute, uncomplicated illness.  [id number] Addendum on chart review: GC+  FINAL CLINICAL IMPRESSION(S) / ED DIAGNOSES   Final diagnoses:  STI (sexually transmitted infection)  Urinary tract infection without hematuria, site unspecified     Rx / DC Orders   ED Discharge Orders          Ordered    doxycycline (VIBRAMYCIN) 50 MG capsule  2 times daily        05/17/22 0313             Note:  This document was prepared using Dragon voice recognition software and may include unintentional dictation errors.   Paulette Blanch, MD 05/17/22 [phone]

## 2022-05-17 NOTE — ED Triage Notes (Signed)
Pt reports milky white discharge from his penis that he noticed tonight. Denies pain.

## 2022-05-17 NOTE — Discharge Instructions (Addendum)
You may find the results of your STD test on MyChart.  Continue antibiotic until finished.  If you are positive for STD, please inform your partner to seek treatment at the health department.  Return to the ER for worsening symptoms, persistent vomiting, difficulty breathing or other concerns.

## 2022-10-26 ENCOUNTER — Emergency Department
Admission: EM | Admit: 2022-10-26 | Discharge: 2022-10-26 | Disposition: A | Discharge status: Home / Self Care | Payer: Medicaid Other | Attending: Emergency Medicine | Admitting: Emergency Medicine

## 2022-10-26 ENCOUNTER — Encounter: Payer: Self-pay | Admitting: Intensive Care

## 2022-10-26 ENCOUNTER — Emergency Department: Payer: Medicaid Other

## 2022-10-26 ENCOUNTER — Other Ambulatory Visit: Payer: Self-pay

## 2022-10-26 DIAGNOSIS — S46001A Unspecified injury of muscle(s) and tendon(s) of the rotator cuff of right shoulder, initial encounter: Secondary | ICD-10-CM | POA: Insufficient documentation

## 2022-10-26 DIAGNOSIS — M7520 Bicipital tendinitis, unspecified shoulder: Secondary | ICD-10-CM

## 2022-10-26 DIAGNOSIS — X500XXA Overexertion from strenuous movement or load, initial encounter: Secondary | ICD-10-CM | POA: Insufficient documentation

## 2022-10-26 DIAGNOSIS — M7521 Bicipital tendinitis, right shoulder: Secondary | ICD-10-CM | POA: Insufficient documentation

## 2022-10-26 MED ORDER — PREDNISONE 10 MG (21) PO TBPK: ORAL_TABLET | ORAL | 0 refills | Status: DC

## 2022-10-26 MED ORDER — TRAMADOL HCL 50 MG PO TABS: 50.0000 mg | ORAL_TABLET | Freq: Four times a day (QID) | ORAL | 0 refills | Status: DC | PRN

## 2022-10-26 MED ORDER — BACLOFEN 10 MG PO TABS: 10.0000 mg | ORAL_TABLET | Freq: Three times a day (TID) | ORAL | 0 refills | Status: AC

## 2022-10-26 NOTE — ED Provider Notes (Signed)
[facility] Provider Note    Event Date/Time   First MD Initiated Contact with Patient 10/26/22 1701     (approximate)   History   Shoulder Pain (right)   HPI  Alejandro Cameron is a 32 y.o. male with no significant past medical history presents emergency department complaining of right shoulder pain.  Patient states he woke up Wednesday morning was unable to move shoulder.  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  He is right-handed.  States he used Tylenol for pain which did help at first.  Trying to put some Bengay and put a heating pad on it but this did not help at all.  States he cannot reach over his head and cannot go behind his back has trouble lifting it to the side      Physical Exam   Triage Vital Signs: ED Triage Vitals  Enc Vitals Group     BP --      Pulse --      Resp --      Temp 10/26/22 1525 97.9 F (36.6 C)     Temp Source 10/26/22 1525 Oral     SpO2 --      Weight 10/26/22 1524 175 lb (79.4 kg)     Height 10/26/22 1524 6\' 2"  (1.88 m)     Head Circumference --      Peak Flow --      Pain Score 10/26/22 1524 7     Pain Loc --      Pain Edu? --      Excl. in [place]? --     Most recent vital signs: Vitals:   10/26/22 1525  Temp: 97.9 F (36.6 C)     General: Awake, no distress.   CV:  Good peripheral perfusion. regular rate and  rhythm Resp:  Normal effort.  Abd:  No distention.   Other:  Right shoulder is tender at the anterior rotator cuff and bicep tendon, decreased range of motion in all planes, grip is equal bilaterally, neurovascular is intact   ED Results / Procedures / Treatments   Labs (all labs ordered are listed, but only abnormal results are displayed) Labs Reviewed - No data to display   EKG     RADIOLOGY X-ray of the right shoulder    PROCEDURES:   Procedures   MEDICATIONS ORDERED IN ED: Medications - No data to display   IMPRESSION / MDM / University / ED COURSE  I reviewed  the triage vital signs and the nursing notes.                              Differential diagnosis includes, but is not limited to, tendinitis, rotator cuff tear, rotator cuff strain, frozen shoulder  Patient's presentation is most consistent with acute complicated illness / injury requiring diagnostic workup.   X-ray of the right shoulder was independently reviewed and interpreted by me as being negative for any acute abnormality  I did not explain all of the findings to the patient.  His physical exam is more consistent with a bicep tendinitis and rotator cuff strain.  He was placed in a sling.  Given a prescription for Sterapred, baclofen, and tramadol for pain.  Given a work note stating no use of the right arm for 1 to 2 weeks.  I did explain to him he needs to follow-up with orthopedics as they may be  able to do a corticosteroid injection.  He is in agreement with this treatment plan.  He was discharged in stable condition with strict instructions to return if worsening.  Also instructed him to take the arm out of the sling and try and do some range of motion exercises throughout the week      FINAL CLINICAL IMPRESSION(S) / ED DIAGNOSES   Final diagnoses:  Biceps tendinitis, unspecified laterality  Injury of right rotator cuff, initial encounter     Rx / DC Orders   ED Discharge Orders          Ordered    predniSONE (STERAPRED UNI-PAK 21 TAB) 10 MG (21) TBPK tablet        10/26/22 1713    baclofen (LIORESAL) 10 MG tablet  3 times daily        10/26/22 1713    traMADol (ULTRAM) 50 MG tablet  Every 6 hours PRN        10/26/22 1713             Note:  This document was prepared using Dragon voice recognition software and may include unintentional dictation errors.    Versie Starks, PA-C 10/26/22 Babette Relic, MD 10/27/22 [phone]

## 2022-10-26 NOTE — ED Triage Notes (Signed)
Patient reports waking up Wednesday and unable to move shoulder. C/o pain. Denies injury

## 2022-10-26 NOTE — Discharge Instructions (Signed)
Follow-up with orthopedics.  You can either call for an appointment or go to their walk-in clinic sometime this week.  Apply ice to the right shoulder.  Wear the sling.  However still when she did take the shoulder out and try and do some range of motion exercises throughout the week.  Take your medications as prescribed

## 2022-11-02 ENCOUNTER — Other Ambulatory Visit: Payer: Self-pay

## 2022-11-02 ENCOUNTER — Emergency Department
Admission: EM | Admit: 2022-11-02 | Discharge: 2022-11-02 | Disposition: A | Discharge status: Home / Self Care | Payer: Medicaid Other | Attending: Emergency Medicine | Admitting: Emergency Medicine

## 2022-11-02 DIAGNOSIS — M778 Other enthesopathies, not elsewhere classified: Secondary | ICD-10-CM

## 2022-11-02 DIAGNOSIS — M7521 Bicipital tendinitis, right shoulder: Secondary | ICD-10-CM | POA: Insufficient documentation

## 2022-11-02 DIAGNOSIS — S46011D Strain of muscle(s) and tendon(s) of the rotator cuff of right shoulder, subsequent encounter: Secondary | ICD-10-CM | POA: Insufficient documentation

## 2022-11-02 DIAGNOSIS — X509XXA Other and unspecified overexertion or strenuous movements or postures, initial encounter: Secondary | ICD-10-CM | POA: Insufficient documentation

## 2022-11-02 DIAGNOSIS — S46911D Strain of unspecified muscle, fascia and tendon at shoulder and upper arm level, right arm, subsequent encounter: Secondary | ICD-10-CM

## 2022-11-02 MED ORDER — NAPROXEN 500 MG PO TABS: 500.0000 mg | ORAL_TABLET | Freq: Two times a day (BID) | ORAL | 0 refills | Status: DC

## 2022-11-02 NOTE — ED Triage Notes (Signed)
Pt states last Wednesday he woke and his right shoulder was in pain. Pt was seen in the ED and asked to follow up with ortho. Pt states he tried, but wont see him because he does not have insurance. Pt states pt still has pain and that is why he is coming in.

## 2022-11-02 NOTE — ED Notes (Signed)
See triage note. Seen here for same shoulder pain and has not been able to follow up with ortho. Pt is ambulatory to treatment room and has unlabored respirations.

## 2022-11-02 NOTE — ED Provider Notes (Signed)
[facility] Emergency Department Provider Note     Event Date/Time   First MD Initiated Contact with Patient 11/02/22 1519     (approximate)   History   Shoulder Pain   HPI  Alejandro Cameron is a 32 y.o. male with a noncontributory medical history, returns to the ED for ongoing right shoulder pain.  Patient presents to the ED noted that he had been unable to establish care with Ortho due to lack of insurance.  He continues to endorse pain disability to the right shoulder.  He denies any interim injury. He was evaluated last week for nontraumatic shoulder pain.  He was diagnosed with tendinitis and sent home with Ultram baclofen, and prednisone. He has been wearing the arm sling as prescribed.   Physical Exam   Triage Vital Signs: ED Triage Vitals  Enc Vitals Group     BP 11/02/22 1450 120/65     Pulse Rate 11/02/22 1450 72     Resp 11/02/22 1450 15     Temp 11/02/22 1450 98.1 F (36.7 C)     Temp Source 11/02/22 1450 Oral     SpO2 11/02/22 1450 98 %     Weight 11/02/22 1449 175 lb (79.4 kg)     Height 11/02/22 1449 6\' 2"  (1.88 m)     Head Circumference --      Peak Flow --      Pain Score 11/02/22 1449 8     Pain Loc --      Pain Edu? --      Excl. in [place]? --     Most recent vital signs: Vitals:   11/02/22 1450  BP: 120/65  Pulse: 72  Resp: 15  Temp: 98.1 F (36.7 C)  SpO2: 98%    General Awake, no distress. NAD HEENT NCAT. PERRL. EOMI. No rhinorrhea. Mucous membranes are moist.  CV:  Good peripheral perfusion.  RESP:  Normal effort.  ABD:  No distention.  MSK:  Right shoulder without obvious deformity or dislocation.  No joint effusion appreciated.  No sulcus sign.  Patient with active range of motion limited only by his subjective complaints of pain.  No rotator cuff deficit appreciated.   ED Results / Procedures / Treatments   Labs (all labs ordered are listed, but only abnormal results are displayed) Labs Reviewed - No data  to display   EKG   RADIOLOGY  No results found.   PROCEDURES:  Critical Care performed: No  Procedures   MEDICATIONS ORDERED IN ED: Medications - No data to display   IMPRESSION / MDM / [place] / ED COURSE  I reviewed the triage vital signs and the nursing notes.                              Differential diagnosis includes, but is not limited to, biceps tendinitis, rotator cuff tendinitis, internal derangement shoulder, OA, DJD, bursitis  Patient's presentation is most consistent with acute, uncomplicated illness.  Patient's diagnosis is consistent with right shoulder tendinitis. Patient will be discharged home with prescriptions for naproxen to take after steroid is completed. Patient is to follow up with Ortho as previously referred, as needed or otherwise directed.  She is also encouraged to wean out of the arm sling at this point to prevent any worsening of disability to the shoulder.  Home exercises are reviewed with the patient.  Patient is given ED  precautions to return to the ED for any worsening or new symptoms.  FINAL CLINICAL IMPRESSION(S) / ED DIAGNOSES   Final diagnoses:  Strain of right shoulder, subsequent encounter  Shoulder tendinitis, right     Rx / DC Orders   ED Discharge Orders          Ordered    naproxen (NAPROSYN) 500 MG tablet  2 times daily with meals        11/02/22 1557             Note:  This document was prepared using Dragon voice recognition software and may include unintentional dictation errors.    Melvenia Needles, PA-C 11/02/22 1854    Naaman Plummer, MD 11/02/22 [phone]

## 2022-11-02 NOTE — Discharge Instructions (Addendum)
Take the prescription Naproxen as directed. Wean out of the shoulder sling with range of motion exercises as demonstrated. Follow-up with Ortho for ongoing symptoms.

## 2023-05-19 ENCOUNTER — Ambulatory Visit (INDEPENDENT_AMBULATORY_CARE_PROVIDER_SITE_OTHER): Payer: BC Managed Care – PPO

## 2023-05-19 ENCOUNTER — Ambulatory Visit (INDEPENDENT_AMBULATORY_CARE_PROVIDER_SITE_OTHER): Payer: BC Managed Care – PPO | Admitting: Podiatry

## 2023-05-19 ENCOUNTER — Encounter: Payer: Self-pay | Admitting: Podiatry

## 2023-05-19 VITALS — BP 114/74 | HR 52

## 2023-05-19 DIAGNOSIS — B351 Tinea unguium: Secondary | ICD-10-CM

## 2023-05-19 DIAGNOSIS — D2372 Other benign neoplasm of skin of left lower limb, including hip: Secondary | ICD-10-CM | POA: Diagnosis not present

## 2023-05-19 DIAGNOSIS — M778 Other enthesopathies, not elsewhere classified: Secondary | ICD-10-CM | POA: Diagnosis not present

## 2023-05-19 DIAGNOSIS — B353 Tinea pedis: Secondary | ICD-10-CM

## 2023-05-19 DIAGNOSIS — M779 Enthesopathy, unspecified: Secondary | ICD-10-CM | POA: Diagnosis not present

## 2023-05-19 MED ORDER — TERBINAFINE HCL 250 MG PO TABS: 250.0000 mg | ORAL_TABLET | Freq: Every day | ORAL | 0 refills | Status: AC

## 2023-05-19 MED ORDER — CLOTRIMAZOLE-BETAMETHASONE 1-0.05 % EX CREA: 1.0000 | TOPICAL_CREAM | Freq: Every day | CUTANEOUS | 3 refills | Status: DC

## 2023-05-19 NOTE — Progress Notes (Signed)
   Chief Complaint  Patient presents with   Foot Pain    "I have pain from my arches up to my toes." N - pain feet L - ball of foot and toes bilateral D - 2 - 3 years O - suddenly, about the same C - tender, sore, pulsating pain A - walking, standing, touch, shoes T - none    Subjective: 32 y.o. male presenting today as a new patient for evaluation of pain and tenderness associated to the bilateral feet.  This has been ongoing for about 6 years now.  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  No past medical history on file.  Past Surgical History:  Procedure Laterality Date   DENTAL SURGERY      Allergies  Allergen Reactions   Ibuprofen Nausea And Vomiting    Objective: Physical Exam General: The patient is alert and oriented x3 in no acute distress.  Dermatology: Hyperkeratotic, discolored, thickened, onychodystrophy noted. Skin is warm, dry and supple bilateral lower extremities. Negative for open lesions or macerations.  Diffuse hyperkeratosis of skin also noted throughout the feet There is also hyperkeratotic skin lesions with a central nucleated core noted to the weightbearing surface of the bilateral forefoot with associated pain and tenderness with palpation  Vascular: Palpable pedal pulses bilaterally. No edema or erythema noted. Capillary refill within normal limits.  Neurological: Grossly intact via light touch  Musculoskeletal Exam: No pedal deformity noted.  Associated tenderness with palpation noted to the hyperkeratotic lesions to the plantar aspect of the bilateral feet  Radiographic exam B/L feet 05/19/2023: Normal osseous mineralization.  No acute fractures identified.  Joint spaces preserved.  Impression: Negative  Assessment: #1  Onychomycosis of toenails #2 chronic tinea pedis bilateral #3 eccrine poroma bilateral  Plan of Care:  -Patient was evaluated. -Today we discussed different treatment options including oral, topical, and laser antifungal treatment modalities.  We discussed their efficacies and side effects.  Patient opts for oral antifungal treatment modality -Prescription for Lamisil 250 mg #90 daily. Pt denies a history of liver pathology or symptoms.  Patient is otherwise healthy -Prescription for Lotrisone cream apply twice daily -Excisional debridement of the hyperkeratotic lesions was performed today using a 312 scalpel.  Patient tolerated this well.  Salicylic acid was applied with a light dressing -Recommend OTC salicylic acid daily -Advised against going barefoot.  Recommend good supportive shoes and sneakers even around the house -Return to clinic 1 month   Felecia Shelling, DPM [facility]  Dr. Felecia Shelling, DPM    [address] [place], [place] [address]                Office [phone]  Fax [phone]

## 2023-06-19 ENCOUNTER — Ambulatory Visit: Payer: BC Managed Care – PPO | Admitting: Podiatry

## 2023-06-23 ENCOUNTER — Ambulatory Visit (INDEPENDENT_AMBULATORY_CARE_PROVIDER_SITE_OTHER): Payer: BC Managed Care – PPO | Admitting: Podiatry

## 2023-06-23 ENCOUNTER — Encounter: Payer: Self-pay | Admitting: Podiatry

## 2023-06-23 VITALS — Ht 74.0 in | Wt 175.0 lb

## 2023-06-23 DIAGNOSIS — D2372 Other benign neoplasm of skin of left lower limb, including hip: Secondary | ICD-10-CM

## 2023-06-23 DIAGNOSIS — B353 Tinea pedis: Secondary | ICD-10-CM | POA: Diagnosis not present

## 2023-06-23 MED ORDER — CLOTRIMAZOLE-BETAMETHASONE 1-0.05 % EX CREA: 1.0000 | TOPICAL_CREAM | Freq: Every day | CUTANEOUS | 3 refills | Status: DC

## 2023-06-23 NOTE — Progress Notes (Signed)
   Chief Complaint  Patient presents with   Foot Pain    Patient is here for 1 month F/U for Onychomycosis of toenails, chronic tinea pedis bilateral,eccrine poroma bilateral Patients states feet are doing well may need refill on creme, has 3 months of the other    Subjective: 32 y.o. male presenting today for follow-up evaluation of pain and tenderness associated to the bilateral feet.    Brief history: This has been ongoing for about 6 years now.  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  No past medical history on file.  Past Surgical History:  Procedure Laterality Date   DENTAL SURGERY      Allergies  Allergen Reactions   Ibuprofen Nausea And Vomiting    Objective: Physical Exam General: The patient is alert and oriented x3 in no acute distress.  Dermatology: Hyperkeratotic, discolored, thickened, onychodystrophy noted. Skin is warm, dry and supple bilateral lower extremities. Negative for open lesions or macerations.  Diffuse hyperkeratosis of skin also noted throughout the feet There is also hyperkeratotic skin lesions with a central nucleated core noted to the weightbearing surface of the bilateral forefoot with associated pain and tenderness with palpation  Vascular: Palpable pedal pulses bilaterally. No edema or erythema noted. Capillary refill within normal limits.  Neurological: Grossly intact via light touch  Musculoskeletal Exam: No pedal deformity noted.  Associated tenderness with palpation noted to the hyperkeratotic lesions to the plantar aspect of the bilateral feet  Radiographic exam B/L feet 05/19/2023: Normal osseous mineralization.  No acute fractures identified.  Joint spaces preserved.  Impression: Negative  Assessment: #1  Onychomycosis of toenails #2 chronic tinea pedis bilateral #3 eccrine poroma bilateral  Plan of Care:  -Patient was evaluated.  Overall improvement - Continue Lamisil 250mg  daily x 90 days -Continue Lotrisone cream apply twice daily -Excisional debridement of the hyperkeratotic lesions was performed today using a 312 scalpel.  Patient tolerated this well.  Salicylic acid was applied with a light dressing -Recommend OTC salicylic acid daily -Advised against going barefoot.  Recommend good supportive shoes and sneakers even around the house -Return to clinic 6 weeks   Felecia Shelling, DPM [facility]  Dr. Felecia Shelling, DPM    [address] [place], [place] [address]                Office [phone]  Fax [phone]

## 2023-08-11 ENCOUNTER — Ambulatory Visit: Payer: BC Managed Care – PPO | Admitting: Podiatry

## 2023-08-18 ENCOUNTER — Encounter: Payer: Self-pay | Admitting: Podiatry

## 2023-08-18 ENCOUNTER — Ambulatory Visit (INDEPENDENT_AMBULATORY_CARE_PROVIDER_SITE_OTHER): Payer: BC Managed Care – PPO | Admitting: Podiatry

## 2023-08-18 DIAGNOSIS — B353 Tinea pedis: Secondary | ICD-10-CM

## 2023-08-18 DIAGNOSIS — D2372 Other benign neoplasm of skin of left lower limb, including hip: Secondary | ICD-10-CM | POA: Diagnosis not present

## 2023-08-18 MED ORDER — CLOTRIMAZOLE-BETAMETHASONE 1-0.05 % EX CREA: 1.0000 | TOPICAL_CREAM | Freq: Every day | CUTANEOUS | 3 refills | Status: AC

## 2023-08-18 NOTE — Progress Notes (Signed)
   Chief Complaint  Patient presents with   Tinea Pedis    They doing a whole lot better.    Subjective: 33 y.o. male presenting today for follow-up evaluation of pain and tenderness associated to the bilateral feet.  Overall the patient states that he feels significantly better.  He was able to play basketball without pain.  He is now able to wear shoes that he has not worn for long period of time without pain  Brief history: This has been ongoing for about 6 years now.  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  No past medical history on file.  Past Surgical History:  Procedure Laterality Date   DENTAL SURGERY      Allergies  Allergen Reactions   Ibuprofen Nausea And Vomiting    Objective: Physical Exam General: The patient is alert and oriented x3 in no acute distress.  Dermatology: Hyperkeratotic, discolored, thickened, onychodystrophy noted. Skin is warm, dry and supple bilateral lower extremities. Negative for open lesions or macerations.  Diffuse hyperkeratosis of skin also noted throughout the feet Overall improvement but there continues to be hyperkeratotic skin lesions with a central nucleated core noted to the weightbearing surface of the bilateral forefoot.  No pain with palpation  Vascular: Palpable pedal pulses bilaterally. No edema or erythema noted. Capillary refill within normal limits.  Neurological: Grossly intact via light touch  Musculoskeletal Exam: No pedal deformity noted.  Associated tenderness with palpation noted to the hyperkeratotic lesions to the plantar aspect of the bilateral feet  Radiographic exam B/L feet 05/19/2023: Normal osseous mineralization.  No acute fractures identified.  Joint spaces preserved.  Impression:  Negative  Assessment: #1 Onychomycosis of toenails #2 chronic tinea pedis bilateral #3 eccrine poroma bilateral  Plan of Care:  -Patient was evaluated.   -Continue oral Lamisil  until completed -Continue Lotrisone  cream apply twice daily -Excisional debridement of the hyperkeratotic lesions was performed today using a 312 scalpel.  Patient tolerated this well.  Salicylic acid  was applied with a light dressing - Continue OTC salicylic acid  daily as needed -Advised against going barefoot.  Recommend good supportive shoes and sneakers even around the house -Return to clinic as needed   Thresa EMERSON Sar, DPM [facility]  Dr. Thresa EMERSON Sar, DPM    [address] [place], [place] [address]                Office [phone]  Fax [phone]
